# Patient Record
Sex: Female | Born: 1935
Health system: Southern US, Community
[De-identification: ages and names within clinical notes are randomized; demographics above are authoritative.]

## PROBLEM LIST (undated history)

## (undated) DIAGNOSIS — G4733 Obstructive sleep apnea (adult) (pediatric): Secondary | ICD-10-CM

## (undated) DIAGNOSIS — E039 Hypothyroidism, unspecified: Secondary | ICD-10-CM

## (undated) DIAGNOSIS — E440 Moderate protein-calorie malnutrition: Secondary | ICD-10-CM

## (undated) DIAGNOSIS — E785 Hyperlipidemia, unspecified: Secondary | ICD-10-CM

## (undated) DIAGNOSIS — I10 Essential (primary) hypertension: Secondary | ICD-10-CM

## (undated) DIAGNOSIS — J90 Pleural effusion, not elsewhere classified: Secondary | ICD-10-CM

## (undated) DIAGNOSIS — I5031 Acute diastolic (congestive) heart failure: Secondary | ICD-10-CM

## (undated) DIAGNOSIS — J45909 Unspecified asthma, uncomplicated: Secondary | ICD-10-CM

## (undated) DIAGNOSIS — N049 Nephrotic syndrome with unspecified morphologic changes: Secondary | ICD-10-CM

## (undated) DIAGNOSIS — N184 Chronic kidney disease, stage 4 (severe): Secondary | ICD-10-CM

## (undated) HISTORY — PX: KNEE SURGERY: SHX244

---

## 2000-09-27 ENCOUNTER — Encounter (INDEPENDENT_AMBULATORY_CARE_PROVIDER_SITE_OTHER): Payer: Self-pay | Admitting: Specialist

## 2000-09-27 ENCOUNTER — Other Ambulatory Visit: Admission: RE | Admit: 2000-09-27 | Discharge: 2000-09-27 | Payer: Self-pay | Admitting: Internal Medicine

## 2000-10-18 ENCOUNTER — Other Ambulatory Visit: Admission: RE | Admit: 2000-10-18 | Discharge: 2000-10-18 | Payer: Self-pay | Admitting: Obstetrics and Gynecology

## 2000-11-08 ENCOUNTER — Encounter: Payer: Self-pay | Admitting: Orthopedic Surgery

## 2000-11-08 ENCOUNTER — Encounter: Admission: RE | Admit: 2000-11-08 | Discharge: 2000-11-08 | Payer: Self-pay | Admitting: Orthopedic Surgery

## 2000-11-09 ENCOUNTER — Encounter (INDEPENDENT_AMBULATORY_CARE_PROVIDER_SITE_OTHER): Payer: Self-pay | Admitting: *Deleted

## 2000-11-09 ENCOUNTER — Ambulatory Visit (HOSPITAL_BASED_OUTPATIENT_CLINIC_OR_DEPARTMENT_OTHER): Admission: RE | Admit: 2000-11-09 | Discharge: 2000-11-09 | Payer: Self-pay | Admitting: Orthopedic Surgery

## 2003-02-01 ENCOUNTER — Encounter (INDEPENDENT_AMBULATORY_CARE_PROVIDER_SITE_OTHER): Payer: Self-pay | Admitting: *Deleted

## 2003-02-01 ENCOUNTER — Observation Stay (HOSPITAL_COMMUNITY): Admission: RE | Admit: 2003-02-01 | Discharge: 2003-02-02 | Payer: Self-pay | Admitting: General Surgery

## 2003-07-01 ENCOUNTER — Inpatient Hospital Stay (HOSPITAL_COMMUNITY): Admission: RE | Admit: 2003-07-01 | Discharge: 2003-07-04 | Payer: Self-pay | Admitting: Orthopedic Surgery

## 2003-07-04 ENCOUNTER — Inpatient Hospital Stay
Admission: RE | Admit: 2003-07-04 | Discharge: 2003-07-10 | Payer: Self-pay | Admitting: Physical Medicine & Rehabilitation

## 2005-03-22 ENCOUNTER — Ambulatory Visit: Payer: Self-pay | Admitting: Physical Medicine & Rehabilitation

## 2005-03-22 ENCOUNTER — Inpatient Hospital Stay (HOSPITAL_COMMUNITY): Admission: RE | Admit: 2005-03-22 | Discharge: 2005-03-25 | Payer: Self-pay | Admitting: Orthopedic Surgery

## 2005-03-25 ENCOUNTER — Inpatient Hospital Stay
Admission: RE | Admit: 2005-03-25 | Discharge: 2005-03-30 | Payer: Self-pay | Admitting: Physical Medicine & Rehabilitation

## 2005-03-25 ENCOUNTER — Ambulatory Visit: Payer: Self-pay | Admitting: Physical Medicine & Rehabilitation

## 2008-09-27 ENCOUNTER — Encounter (INDEPENDENT_AMBULATORY_CARE_PROVIDER_SITE_OTHER): Payer: Self-pay | Admitting: *Deleted

## 2009-01-29 ENCOUNTER — Encounter (INDEPENDENT_AMBULATORY_CARE_PROVIDER_SITE_OTHER): Payer: Self-pay | Admitting: *Deleted

## 2009-02-28 ENCOUNTER — Encounter (INDEPENDENT_AMBULATORY_CARE_PROVIDER_SITE_OTHER): Payer: Self-pay | Admitting: *Deleted

## 2009-03-03 ENCOUNTER — Ambulatory Visit: Payer: Self-pay | Admitting: Internal Medicine

## 2009-04-01 ENCOUNTER — Ambulatory Visit: Payer: Self-pay | Admitting: Internal Medicine

## 2009-04-03 ENCOUNTER — Encounter: Payer: Self-pay | Admitting: Internal Medicine

## 2010-04-14 NOTE — Letter (Signed)
Summary: Patient Notice- Polyp Results  [facility]  [address] [place], [place] [address]   Phone: [phone]  Fax: [phone]        April 03, 2009 MRN: [id number]    JAZARIYAH GRIBBINS [place], [place]  [address]    Dear [name],  I am pleased to inform you that the colon polyp(s) removed during your recent colonoscopy was (were) found to be benign (no cancer detected) upon pathologic examination.    Additional information/recommendations:  __ No further action with gastroenterology is needed at this time. Please      follow-up with your primary care physician for your other healthcare      needs.  Please call us if you are having persistent problems or have questions about your condition that have not been fully answered at this time.  Sincerely,  Irene Shipper MD  This letter has been electronically signed by your physician.  Appended Document: Patient Notice- Polyp Results Letter mailed 1.21.11

## 2010-04-14 NOTE — Letter (Signed)
Summary: Recall Colonoscopy Letter  [facility]  [place], [place] [address]   Phone: [phone]  Fax: [phone]      September 27, 2008 MRN: [id number]   DELAPHINE DALMAU [place], [place]  [address]   Dear [name],   According to your medical record, it is time for you to schedule a Colonoscopy. The American Cancer Society recommends this procedure as a method to detect early colon cancer. Patients with a family history of colon cancer, or a personal history of colon polyps or inflammatory bowel disease are at increased risk.  This letter has beeen generated based on the recommendations made at the time of your procedure. If you feel that in your particular situation this may no longer apply, please contact our office.  Please call our office at [phone] to schedule this appointment or to update your records at your earliest convenience.  Thank you for cooperating with [place] to provide you with the very best care possible.   Sincerely,  Docia Chuck. Henrene Pastor, M.D  [facility] [phone]

## 2010-04-14 NOTE — Letter (Signed)
Summary: Previsit letter  [facility]  [place], [place] [address]   Phone: [phone]  Fax: [phone]       01/29/2009 MRN: [id number]  Jacqueline Li [address] [place], [place]  [address]  [place]  Dear [name],  Welcome to the Gastroenterology Division at [facility].    You are scheduled to see a nurse for your pre-procedure visit on 03-03-09 at 1pm on the 3rd floor at [facility] [place] [employer].  We ask that you try to arrive at our office 15 minutes prior to your appointment time to allow for check-in.  Your nurse visit will consist of discussing your medical and surgical history, your immediate family medical history, and your medications.    Please bring a complete list of all your medications or, if you prefer, bring the medication bottles and we will list them.  We will need to be aware of both prescribed and over the counter drugs.  We will need to know exact dosage information as well.  If you are on blood thinners (Coumadin, Plavix, Aggrenox, Ticlid, etc.) please call our office today/prior to your appointment, as we need to consult with your physician about holding your medication.   Please be prepared to read and sign documents such as consent forms, a financial agreement, and acknowledgement forms.  If necessary, and with your consent, a friend or relative is welcome to sit-in on the nurse visit with you.  Please bring your insurance card so that we may make a copy of it.  If your insurance requires a referral to see a specialist, please bring your referral form from your primary care physician.  No co-pay is required for this nurse visit.     If you cannot keep your appointment, please call [phone] to cancel or reschedule prior to your appointment date.  This allows [place] the opportunity to schedule an appointment for another patient in need of care.    Thank you for choosing [facility] for your medical needs.  We  appreciate the opportunity to care for you.  Please visit [place] at our website  to learn more about our practice.                     Sincerely.                                                                                                                   The Gastroenterology Division

## 2010-04-14 NOTE — Letter (Signed)
Summary: [facility] Instructions  [facility]  [place], [place] [address]   Phone: [phone]  Fax: [phone]       Jacqueline Li    [birth year]    MRN: [id number]        Procedure Day Sudie Grumbling: [facility] 03/19/09     Arrival Time: 8:30AM     Procedure Time: 9:30AM     Location of Procedure:                    [facility] (4th Floor)                        [place]   Starting 5 days prior to your procedure 03/14/09 do not eat nuts, seeds, popcorn, corn, beans, peas,  salads, or any raw vegetables.  Do not take any fiber supplements (e.g. Metamucil, Citrucel, and Benefiber).  THE DAY BEFORE YOUR PROCEDURE         DATE: TUESDAY  DAY: TUESDAY  1.  Drink clear liquids the entire day-NO SOLID FOOD  2.  Do not drink anything colored red or purple.  Avoid juices with pulp.  No orange juice.  3.  Drink at least 64 oz. (8 glasses) of fluid/clear liquids during the day to prevent dehydration and help the prep work efficiently.  CLEAR LIQUIDS INCLUDE: Water Jello Ice Popsicles Tea (sugar ok, no milk/cream) Powdered fruit flavored drinks Coffee (sugar ok, no milk/cream) Gatorade Juice: apple, white grape, white cranberry  Lemonade Clear bullion, consomm, broth Carbonated beverages (any kind) Strained chicken noodle soup Hard Candy                             4.  In the morning, mix first dose of MoviPrep solution:    Empty 1 Pouch A and 1 Pouch B into the disposable container    Add lukewarm drinking water to the top line of the container. Mix to dissolve    Refrigerate (mixed solution should be used within 24 hrs)  5.  Begin drinking the prep at 5:00 p.m. The MoviPrep container is divided by 4 marks.   Every 15 minutes drink the solution down to the next mark (approximately 8 oz) until the full liter is complete.   6.  Follow completed prep with 16 oz of clear liquid of your choice (Nothing  red or purple).  Continue to drink clear liquids until bedtime.  7.  Before going to bed, mix second dose of MoviPrep solution:    Empty 1 Pouch A and 1 Pouch B into the disposable container    Add lukewarm drinking water to the top line of the container. Mix to dissolve    Refrigerate  THE DAY OF YOUR PROCEDURE      DATE: 03/19/09 DAY: WEDNESDAY  Beginning at 4:30a.m. (5 hours before procedure):         1. Every 15 minutes, drink the solution down to the next mark (approx 8 oz) until the full liter is complete.  2. Follow completed prep with 16 oz. of clear liquid of your choice.    3. You may drink clear liquids until 7:30AM (2 HOURS BEFORE PROCEDURE).   MEDICATION INSTRUCTIONS  Unless otherwise instructed, you should take regular prescription medications with a small sip of water   as early as possible the morning of your procedure.  Additional medication instructions:  Do not take Hydrochlorathiazide morning of procedure._         OTHER INSTRUCTIONS  You will need a responsible adult at least 75 years of age to accompany you and drive you home.   This person must remain in the waiting room during your procedure.  Wear loose fitting clothing that is easily removed.  Leave jewelry and other valuables at home.  However, you may wish to bring a book to read or  an iPod/MP3 player to listen to music as you wait for your procedure to start.  Remove all body piercing jewelry and leave at home.  Total time from sign-in until discharge is approximately 2-3 hours.  You should go home directly after your procedure and rest.  You can resume normal activities the  day after your procedure.   The day of your procedure you should not:   Drive   Make legal decisions   Operate machinery   Drink alcohol   Return to work  You will receive specific instructions about eating, activities and medications before you leave.    The above instructions have been reviewed and  explained to me by   Alphonsa Gin RN  March 03, 2009 1:09 PM    I fully understand and can verbalize these instructions _____________________________ Date _________

## 2010-04-14 NOTE — Procedures (Signed)
Summary: Colonoscopy  Patient: Jacqueline Li Note: All result statuses are Final unless otherwise noted.  Tests: (1) Colonoscopy (COL)   COL Colonoscopy           [facility]     [place], [place]  [address]           COLONOSCOPY PROCEDURE REPORT           PATIENT:  Jacqueline, Li  MR#:  [id number]     BIRTHDATE:  1927/07/31, 81 yrs. old  GENDER:  female           ENDOSCOPIST:  Docia Chuck. Geri Seminole, MD     Referred by:  Surveillance Program Recall,           PROCEDURE DATE:  04/01/2009     PROCEDURE:  Colonoscopy with snare polypectomy x 4     ASA CLASS:  Class II     INDICATIONS:  history of pre-cancerous (adenomatous) colon polyps     (2002 and 2005 w/ small adenomas)           MEDICATIONS:   Fentanyl 50 mcg IV, Versed 7 mg IV           DESCRIPTION OF PROCEDURE:   After the risks benefits and     alternatives of the procedure were thoroughly explained, informed     consent was obtained.  Digital rectal exam was performed and     revealed no abnormalities.   The LB CF-H180AL [id number] endoscope     was introduced through the anus and advanced to the cecum, which     was identified by both the appendix and ileocecal valve, without     limitations.Time to cecum = 6:00 min. The quality of the prep was     excellent, using MoviPrep.  The instrument was then slowly (time     =13:32 min) withdrawn as the colon was fully examined.     <<PROCEDUREIMAGES>>           FINDINGS:  Four polyps were found ascending colon (39mm,4mm,6mm)     and sigmoid colon (11mm). Polyps were snared without cautery.     Retrieval was successful.   Severe diverticulosis was found     throughout the colon.   Retroflexed views in the rectum revealed     internal hemorrhoids.    The scope was then withdrawn from the     patient and the procedure completed.           COMPLICATIONS:  None           ENDOSCOPIC IMPRESSION:     1) Four polyps ascending colon and sigmoid colon -  removed     2) Severe diverticulosis throughout the colon     3) Internal hemorrhoids           RECOMMENDATIONS:     1) Return to the care of your primary provider. GI follow up as     needed           ______________________________     Docia Chuck. Geri Seminole, MD           CC:  Leanna Battles, MD; The Patient           n.     eSIGNED:   Docia Chuck. Geri Seminole at 04/01/2009 02:44 PM           Jacqueline Li, [id number]  Note: An exclamation  mark (!) indicates a result that was not dispersed into the flowsheet. Document Creation Date: 04/01/2009 2:44 PM _______________________________________________________________________  (1) Order result status: Final Collection or observation date-time: 04/01/2009 14:36 Requested date-time:  Receipt date-time:  Reported date-time:  Referring Physician:   Ordering Physician: Lavena Bullion [phone]) Specimen Source:  Source: Tawanna Cooler Order Number: [id number] Lab site:

## 2010-04-14 NOTE — Miscellaneous (Signed)
Summary: Moviprep rx  Clinical Lists Changes  Medications: Added new medication of MOVIPREP 100 GM  SOLR (PEG-KCL-NACL-NASULF-NA ASC-C) As per prep instructions. - Signed Rx of MOVIPREP 100 GM  SOLR (PEG-KCL-NACL-NASULF-NA ASC-C) As per prep instructions.;  #1 x 0;  Signed;  Entered by: Alphonsa Gin RN;  Authorized by: Irene Shipper MD;  Method used: Electronically to [facility] [address] [place], [place]  [id number], Ph: [phone], Fax: [phone]    Prescriptions: MOVIPREP 100 GM  SOLR (PEG-KCL-NACL-NASULF-NA ASC-C) As per prep instructions.  #1 x 0   Entered by:   Alphonsa Gin RN   Authorized by:   Irene Shipper MD   Signed by:   Alphonsa Gin RN on 03/03/2009   Method used:   Electronically to        [facility] (retail)       [place], [place]  [id number]       Ph: [phone]       Fax: [phone]   RxID:   [id number]

## 2010-07-31 NOTE — Op Note (Signed)
NAME:  Jacqueline Li                           ACCOUNT NO.:  [id number]   MEDICAL RECORD NO.:  [id number]                   PATIENT TYPE:  INP   LOCATION:  [id number]                                 FACILITY:  [facility]   PHYSICIAN:  Gaynelle Arabian, M.D.                 DATE OF BIRTH:  October 18, 1927   DATE OF PROCEDURE:  07/01/2003  DATE OF DISCHARGE:                                 OPERATIVE REPORT   PREOPERATIVE DIAGNOSIS:  Osteoarthritis, left knee.   POSTOPERATIVE DIAGNOSIS:  Osteoarthritis, left knee.   PROCEDURE:  Left total knee arthroplasty.   SURGEON:  Dione Plover. Aluisio, M.D.   ASSISTANT:  Arlee Muslim, P.A.   ANESTHESIA:  General with postop Marcaine pain pump.   DRAINS:  Hemovac x 1.   COMPLICATIONS:  None.   TOURNIQUET TIME:  52 minutes at 300 mmHg.   CONDITION:  Stable to recovery.   BRIEF CLINICAL NOTE:  Jacqueline Li is a 77 year old female with severe end-  stage osteoarthritis of the left knee with pain refractory to nonoperative  management.  She presents now for left total knee arthroplasty.   PROCEDURE IN DETAIL:  After the successful administration of general  anesthetic, a tourniquet is placed high on the left thigh and left lower  extremity prepped and draped in the usual sterile fashion.  Extremity is  wrapped in Esmarch, knee flexed, tourniquet inflated to 300 mmHg.  Midline  incision is made with a 10 blade through the subcutaneous tissue to the  level of the extensor mechanism.  A fresh blade is used to make a medial  parapatellar arthrotomy.  Then the soft tissue over the proximal and medial  tibia is subperiosteally elevated to the joint line with a knife and into  the semimembranous bursa with a curved osteotome.  Patella was then everted,  knee flexed 90 degrees, ACL PCL removed.  Drill is used to create a starting  hole in the distal femur; canal is irrigated, and then the 5-degree left  valgus alignment guide placed.  Referencing off the posterior  condyle,  rotation is marked and the block pinned to remove 10 mm off the distal  femur.  Distal femoral resection is made with an oscillating saw.  The  sizing block is placed, and size 4 is most appropriate.  The size 4 cutting  block is placed with rotation marked at the epicondylar axis and then the  anterior and posterior cuts made.   The tibia is then subluxed forward and the menisci removed.  The  extramedullary tibial alignment guide is placed referencing proximally at  the medial aspect of the tibial tubercle and distally on the second  metatarsal axis and tibial crest.  The block is pinned to remove 10 mm off  the nondeficient lateral side.  Tibial resection is made with an oscillating  saw.  The size 3 is the most appropriate tibial component.  Then the  proximal tibia is then prepared with the modular drill and keel punch.  Femoral preparation is completed with the intercondylar and chamfer cuts.   Trial size 3 mobile bearing tibia with a size 4 posterior stabilized femur  and then a size 4, 10 mm posterior stabilized rotating platform insert are  placed.  She is within a few degrees of full extension with excellent varus  and valgus balance throughout full range of motion.  This was a major  improvement over her 25-degree preop flexion contracture.  She had excellent  balance throughout motion from extension down through 125 degrees of  flexion.  The patella is everted and the thickness measured to be 25 mm,  free hand resection taken to 14 mm, 38 template placed, lug holes drilled,  trial patella placed, and it tracks normally.  The osteophytes are then  removed off the posterior femur with the trial in place.  The cut bone  surfaces are then prepared with pulsatile lavage, cement mixed and once  ready for implantation, the size 3 mobile bearing tibial tray, size 4  posterior stabilized femur, and 38 patella are cemented into place, and the  patella is held with a clamp.   Trial 10 mm insert is placed, knee held in  full extension, and all extruded cement removed.  Once the cement is fully  hardened, then the permanent 10 mm posterior stabilized rotating platform  insert is placed into the tibial tray.  The wound is copiously irrigated  with antibiotic solution and extensor mechanism closed over a Hemovac drain  with interrupted #1 PDS.  Flexion against gravity is 135 degrees.  Tourniquet was released for a total time of 52 minutes.  Subcu is closed  with interrupted 2-0 Vicryl, subcuticular running 4-0 Monocryl.  The  catheter for the pain pump is placed.  The pump is initiated.  Hemovac  hooked to suction.  The incision is clean and dry and Steri-Strips and a  bulky sterile dressing applied.  She is then awakened and transported to  recovery in stable condition.                                               Gaynelle Arabian, M.D.    FA/MEDQ  D:  07/01/2003  T:  07/01/2003  Job:  [id number]

## 2010-07-31 NOTE — Op Note (Signed)
NAME:  Jacqueline Li, Jacqueline Li                 ACCOUNT NO.:  [id number]   MEDICAL RECORD NO.:  [id number]          PATIENT TYPE:  INP   LOCATION:  0001                         FACILITY:  [facility]   PHYSICIAN:  Gaynelle Arabian, M.D.    DATE OF BIRTH:  01-18-1928   DATE OF PROCEDURE:  03/22/2005  DATE OF DISCHARGE:                                 OPERATIVE REPORT   PREOPERATIVE DIAGNOSIS:  Osteoarthritis right knee.   POSTOPERATIVE DIAGNOSIS:  Osteoarthritis right knee.   PROCEDURE:  Right total knee arthroplasty.   SURGEON:  Gaynelle Arabian, M.D.   ASSISTANT:  Alexzandrew L. Dara Lords, P.A.   ANESTHESIA:  General with postop Marcaine pain pump.   ESTIMATED BLOOD LOSS:  Minimal.   DRAIN:  Hemovac x1.   TOURNIQUET TIME:  46 minutes at 300 mmHg.   COMPLICATIONS:  None.   CONDITION:  Stable to recovery.   BRIEF CLINICAL NOTE:  Ms. Broshears is a 77 year old female with end-stage  osteoarthritis of the right knee with intractable pain. She has had a  previous successful left total knee arthroplasty; and presents now for right  total knee arthroplasty.   PROCEDURE IN DETAIL:  After successful administration of general anesthetic,  a tourniquet was placed on the right thigh and right lower extremity prepped  and draped in the usual sterile fashion. Extremity was wrapped in Esmarch,  knee flexed, tourniquet inflated to 300 mmHg. A standard midline incision  was made with a 10-blade through the subcutaneous tissue to the level of the  extensor mechanism. A fresh blade is used to make a medial parapatellar  arthrotomy, then the soft tissue over the proximal medial tibia  subperiosteally elevated to the joint line with a knife; and into the  semimembranosus bursa with a Cobb elevator. Soft tissue of the proximal  lateral tibia is also elevated with attention being paid to avoid the  patellar tendon on tibial tubercle. The patella is everted and knee flexed  90 degrees. ACL and PCL removed. Drill was  used to create a starting hole in  the distal femur and the canal irrigated. A 5-degrees right valgus alignment  guide is placed and referencing up the posterior condyle rotation is marked;  and the block is pinned to remove 11 mm off the distal femur. We took 2 mm  more than usual due to her flexion contracture. The sizing block is placed,  size 3 is most appropriate. The rotation is marked off the epicondylar axis;  and the size 3 cutting block is placed and the anterior, posterior, and  chamfer cuts are made.   The tibia subluxed forward and the menisci removed. Extramedullary tibial  alignment guide is placed referencing proximally to the medial aspect of the  tibial tubercle and distally along the second metatarsal axis and tibial  crest. The block is pinned to remove 10 mm off the non deficient lateral  side. Tibial resection is made with an oscillating saw. Size 3 is the most  appropriate tibial component; and the proximal tibia is prepared with the  modular drill and keel punch for a size  3. The femoral preparation is  completed with the intercondylar cut.   A size 3 mobile bearing tibial trial with a size 3 posterior stabilized  femoral trial and a 10-mm, posterior, stabilized rotating platform insert  trial were placed. With the 10, full extension is achieved with excellent  varus and valgus balance throughout full range of motion. The patella was  then everted.  Thickness measured to be 24 mm. Freehand resection is taken  to 14 mm; 38 template is placed; lug holes are drilled; trial patella is  placed and it tracks normally. The osteophytes and then removed off the  posterior femur with the trials in place. All trials are removed and the cut  bone surfaces prepared with pulsatile lavage.   Cement is mixed and once ready for implantation the size 3 mobile bearing  tibial tray, size 3 posterior stabilized femur, and 38 patella are cemented  into place; and the patella is held  with the clamp. A trial 10-mm insert is  placed and the knee held in full extension and all extruded cement removed.  Once the cement was fully hardened, then a permanent 10-mm, posterior,  stabilized rotating platform insert is placed into the tibial tray. The  wound is copiously irrigated with saline solution, and the extensor  mechanism closed over Hemovac drain with interrupted #1 PDS. Flexion against  gravity is 135 degrees. Tourniquet is released for a total time of 46  minutes. Subcu is closed with interrupted 2-0 Vicryl; subcuticular with  running 4-0 Monocryl. The catheter for the Marcaine pain pump is then  placed; and the pump is initiated. The Hemovac drain is then hooked to  suction. Steri-Strips and a bulky sterile dressing were applied; and she is  placed into a knee immobilizer, awakened, and transported to recovery in  stable condition.      Gaynelle Arabian, M.D.  Electronically Signed     FA/MEDQ  D:  03/22/2005  T:  03/23/2005  Job:  [id number]

## 2010-07-31 NOTE — Op Note (Signed)
NAME:  MARIETTA, BUSHNELL                           ACCOUNT NO.:  [id number]   MEDICAL RECORD NO.:  [id number]                   PATIENT TYPE:  AMB   LOCATION:  DAY                                  FACILITY:  [facility]   PHYSICIAN:  Marland Kitchen T. Hoxworth, M.D.          DATE OF BIRTH:  [birth year]   DATE OF PROCEDURE:  02/01/2003  DATE OF DISCHARGE:                                 OPERATIVE REPORT   PREOPERATIVE DIAGNOSIS:  Cholelithiasis with cholecystitis.   POSTOPERATIVE DIAGNOSIS:  Cholelithiasis with cholecystitis.   PROCEDURE:  Laparoscopic cholecystectomy with intraoperative cholangiogram.   SURGEON:  Marland Kitchen T. Hoxworth, M.D.   ASSISTANTLew Dawes. Rosana Hoes, M.D.   ANESTHESIA:  General.   BRIEF HISTORY:  Laisha Troendle is a 77 year old white female with episodic  epigastric abdominal pain. She has also had mildly elevated LFTs and lipase  on occasion. Ultrasound shows echogenic material in the gallbladder  consistent with sludge or small stones. Laparoscopic cholecystectomy with  cholangiogram has been recommended. The nature of the procedure,  indications, risks of bleeding, infection, bile leak and bile duct injury  were discussed and understood preoperatively. She is now brought to the  operating room for this procedure.   DESCRIPTION OF PROCEDURE:  The patient was brought to the operating room,  placed in supine position on the operating table and general endotracheal  anesthesia was induced. PAS were in place. She received preoperative  antibiotics. The abdomen was widely sterilely prepped and draped. The  abdomen was accessed through a 1 cm open Hasson incision at the umbilicus  and standard four port technique was used. The gallbladder appeared somewhat  edematous, slightly thick walled. The fundus was grasped and elevated over  the liver, infundibulum retracted inferolaterally. The fibrofatty tissue was  stripped down off the neck of the gallbladder toward the porta hepatis.  The  distal gallbladder and Calot's triangle was thoroughly dissected. The cystic  duct was identified and the cystic duct gallbladder junction dissected 360  degrees. When the anatomy was clear, the cystic duct was clipped at the  gallbladder junction, operative cholangiogram obtained through the cystic  duct. This showed good filling of normal intrahepatic ducts and common bile  duct with free flow into the duodenum. There were some slight filling  defects of the distal common bile duct which appeared to fill out and just  be some mucosal folds although a little bit of sludge in this area could not  be ruled out. There was no obstruction. The Cholangiocath was then removed  and the cystic duct was triply clipped proximally and divided. The cystic  artery clearly identified going up the gallbladder wall was divided between  two proximal and one distal clip. The gallbladder was then dissected free  from its bed using hook cautery. The posterior branch of the artery was  clipped. The gallbladder was removed through the umbilicus. The right upper  quadrant was thoroughly  irrigated and complete hemostasis assured. Trocars  removed under direct  vision, all CO2 evacuated. The mattress suture was secured at the umbilicus.  Skin incisions were closed with interrupted subcuticular 4-0 Monocryl and  Steri-Strips. Sponge, needle and instrument counts were correct. Dry sterile  dressings were applied and the patient taken to recovery in good condition.                                               Darene Lamer. Hoxworth, M.D.    [place]  D:  02/01/2003  T:  02/01/2003  Job:  [id number]

## 2010-07-31 NOTE — Discharge Summary (Signed)
NAMEAURIELLA, STCHARLES NO.:  [id number]   MEDICAL RECORD NO.:  [id number]          PATIENT TYPE:  INP   LOCATION:  [id number]                         FACILITY:  [facility]   PHYSICIAN:  Gaynelle Arabian, M.D.    DATE OF BIRTH:  December 24, 1927   DATE OF ADMISSION:  03/22/2005  DATE OF DISCHARGE:  03/25/2005                                 DISCHARGE SUMMARY   ADMISSION DIAGNOSES:  1.  Osteoarthritis right knee.  2.  Asthma.  3.  Hypertension.  4.  Hypercholesterolemia.  5.  Hemorrhoids.   DISCHARGE DIAGNOSES:  1.  Osteoarthritis right knee status post right total knee arthroplasty.  2.  Postoperative hypokalemia, improved.  3.  Postoperative hyponatremia.  4.  Asthma.  5.  Hypertension.  6.  Hypercholesterolemia.  7.  Hemorrhoids.   PROCEDURE:  On March 22, 2005 right total knee, surgeon Dr. Wynelle Link,  assistant Arlee Muslim, PA-C, anesthesia general. Tourniquet time 46  minutes.   BRIEF HISTORY:  Ms. Haydon is a 77 year old female with end-stage arthritis  of the right knee with intractable pain, successful left total knee and now  presents with right total knee.   CONSULTATIONS:  Rehab services.   LABORATORY DATA:  Preop CBC, hemoglobin 15, hematocrit of 43.3. Postop  hemoglobin 11.9, last noted H&H 10.8 and 31.0. PT and PTT preop 13.5 and 33  respectively. INR 1.0. Serial pro times followed, last noted PT/INR 15.3 and  1.2. Chem panel on admission elevated ALT of 54, elevated total bili of 1.3.  Remaining chem panel within normal limits. Serial BMETs are followed. Sodium  did drop from 138 to 132, stabilized at 132. Potassium went down to 3.4,  back up to 3.7. Preop UA, trace leukocyte esterase,  0-2 white cells,  otherwise negative. Blood group type O positive. EKG on March 17, 2005,  borderline EKG normal sinus rhythm unconfirmed.   HOSPITAL COURSE:  Admitted to [facility], tolerated procedure  well, later to the recovery room and then to the orthopedic  floor, started  on PCA and p.o. analgesics, hemovac drain placed at the time of surgery was  pulled on day 1. He underwent a rehab eval and felt the patient could  possibly benefit from [facility] depending on progress. By day 2 had been doing  better, had cramps on day 1 which had improved. By day 2 she had a little  bit of hypokalemia and was given K-Dur for this. She started getting up with  physical therapy. By day 3, the patient was resting well, had been weaned  over the patient p.o. meds. It was noted later that day that a bed became  available on the rehab SACU  unit. The patient lived alone, felt to be a  good candidate and was transferred at that time.   PLAN:  1.  The patient was transferred to [facility] .  2.  Discharge diagnoses please see above.  3.  Discharge meds:  Continue current medications as per the [facility].  4.  Diet as tolerated.  5.  Activity - weightbearing as tolerated,  total knee protocol. PT and OT      for gait training, ambulation and ADLs. Followup 2 weeks from surgery or      following the discharge from rehab unit.   DISPOSITION:  [facility].   CONDITION ON DISCHARGE:  Improved.      Alexzandrew L. Dara Lords, P.A.      Gaynelle Arabian, M.D.  Electronically Signed    ALP/MEDQ  D:  05/13/2005  T:  05/14/2005  Job:  [id number]   cc:   Rehab Services   Ermalene Searing. Philip Aspen, M.D.  Fax: [phone]

## 2010-07-31 NOTE — Discharge Summary (Signed)
NAME:  Jacqueline Li, Jacqueline Li                           ACCOUNT NO.:  [id number]   MEDICAL RECORD NO.:  [id number]                   PATIENT TYPE:  INP   LOCATION:  [id number]                                 FACILITY:  [facility]   PHYSICIAN:  Gaynelle Arabian, M.D.                 DATE OF BIRTH:  1927/10/14   DATE OF ADMISSION:  07/01/2003  DATE OF DISCHARGE:  07/04/2003                                 DISCHARGE SUMMARY   ADMISSION DIAGNOSES:  1. Osteoarthritis, left knee.  2. Hypertension.  3. Hemorrhoids.  4. Hypercholesterolemia.   DISCHARGE DIAGNOSES:  1. Osteoarthritis, left knee, status post left total knee arthroplasty.  2. Mild postoperative blood loss anemia, did not require transfusion.  3. Postoperative hyponatremia, improving  4. Postoperative hypokalemia, improved  5. Hypertension.  6. Hemorrhoids.  7. Hypercholesterolemia.   PROCEDURE:  Date of surgery, July 01, 2003, left total knee arthroplasty.  Surgeon Dr. Gaynelle Arabian, assistant Arlee Muslim, PA-C.  Anesthesia general  with a postop Marcaine pain pump.  Hemovac drain x 1.  Tourniquet time 52  minutes, 300 mmHg.   BRIEF HISTORY:  Jacqueline Li is a 77 year old female with severe end-stage  arthritis of the left knee.  Pain has been refractory to nonoperative  management.  She presents now for left total knee arthroplasty.   CONSULTS:  Rehabilitation services, Dr. Naaman Plummer.   LABORATORY DATA:  CBC on admission:  Hemoglobin 14.7, hematocrit 43.0, white  cell count 7.0, differential within normal limits.  Postop H&H 10.8 and  31.5.  Last noted H&H 10.3 and 29.8.  PT/PTT preop 12.4 and 32 respectively.  INR 0.9.  Serial pro times followed.  Last noted PT/INR 15.2 and 1.3.  Chem  panel on admission all within normal limits.  Serial BMETs are followed.  Sodium dropped from 141 down to 127 and back up to 132.  Potassium 4.0 down  to 3.3, and back up to .37.  Chloride dropped from 102 to 89 back up to 96.  Glucose 109-156 back down to  105.  Calcium dropped 9.8 down to 7.9.  Urinalysis preop, moderate leukocyte esterase with only a few epithelial  cells, 3-6 white cells, rare bacteria.  Blood group type O positive.  Chest  x-ray dated January 30, 2003, no active disease, probable small calcified  granuloma on the right lung.   HOSPITAL COURSE:  The patient was admitted to [facility], taken to  the OR, underwent the above-stated procedure without complications.  The  patient tolerated the procedure well and later transferred to recovery and  then to the orthopedic floor for continued postop care.  Vital signs were  followed.  The patient was given 24 hours postop IV antibiotics in the form  of Ancef, Coumadin for DVT prophylaxis.  PT and OT were consulted.  Rehab  consulted postop.  The patient was placed weightbearing as tolerated, start  back on her  home medications.  Hemovac drain which was placed at time of  surgery was pulled on postop day one.  She got up and walked about 20 feet  on day one and was actually dong quite well.  She had a little bit of volume  overload and a low sodium.  The fluids were decreased, and she underwent a  little bit of mild diuresis.  By day two, her sodium had dropped a little  bit further down to 127.  She also had a low potassium of 3.3.  She was  placed on potassium supplements.  The patient's medical doctor was Dr.  Sharlett Iles.  He came by as a courtesy and did notice that she had a little  bit of drop in her sodium.  Hydrochlorothiazide was discontinued.  Her  sodium actually did improve.  She started to progress well with physical  therapy.  By day three, her sodium was back up to 132, and her potassium  responded at 3.7, and both had improved.  She had been seen postop by Dr.  Naaman Plummer, and felt she would be an excellent candidate for rehab services.  She is progressing well with physical therapy.  She was making improvements,  and a bed did become available on the [place] on  July 04, 2003.  Medically she  was stable and improving from an orthopedic standpoint and was transferred  over at that time for continued therapy.   DISCHARGE PLAN:  The patient discharged to [facility] on July 04, 2003.   DISCHARGE DIAGNOSES:  Please see above.   DISCHARGE MEDICATIONS:  Percocet for pain.  Robaxin for spasm.  Coumadin for  DVT prophylaxis.  Continue home medications.   DIET:  Hypertensive type diet, low cholesterol.   ACTIVITIES:  1. Weightbearing as tolerated to the left lower extremity.  2. Continue __________ ambulation, ADLs as per PT/OT while on rehab and     SACU.   FOLLOW UP:  Two weeks from surgery or following discharge from [facility].  Call office for an appointment at [phone].   DISPOSITION:  To [place] [facility].   CONDITION ON DISCHARGE:  Improved.     Jacqueline Li, P.A.              Gaynelle Arabian, M.D.    ALP/MEDQ  D:  07/24/2003  T:  07/24/2003  Job:  [id number]   cc:   Gaynelle Arabian, M.D.  [facility]  [address]  [place] [address]  Fax: [place]. Philip Aspen, M.D.  [address]  [place]  [place] [address]  Fax: [phone]   Meredith Staggers, M.D.  [place]. [address] [place]  [place] [address]  Fax: [phone]

## 2010-07-31 NOTE — H&P (Signed)
NAME:  Jacqueline Li, Jacqueline Li                           ACCOUNT NO.:  [id number]   MEDICAL RECORD NO.:  [id number]                   PATIENT TYPE:  INP   LOCATION:  NA                                   FACILITY:  [facility]   PHYSICIAN:  Gaynelle Arabian, M.D.                 DATE OF BIRTH:  30-Nov-1927   DATE OF ADMISSION:  DATE OF DISCHARGE:                                HISTORY & PHYSICAL   ANTICIPATED DATE OF ADMISSION:  The patient see in the office for History  and Physical June 25, 2003 for admission on July 01, 2003.   CHIEF COMPLAINT:  Left knee pain.   HISTORY OF PRESENT ILLNESS:  The patient is a 76 year old female with a  longstanding history of progressive left knee pain.  She has had a previous  arthroscopy about 10 years ago by Dr. Durward Fortes and was told she has  arthritis at that time.  Over the past several years, progressively gotten  worse now to the point where it is hurting all the time even at night.  It  is interfering with her daily activities.  X-rays in the office show severe  end-stage arthritis of the left knee with bone-on-bone in the patellofemoral  medial compartment.  It is felt due to the pain that she is having and the  significant findings on x-rays, the patient would benefit from undergoing a  knee replacement.  Risks and benefits of the knee procedure have been  discussed with the patient and she elects to proceed with surgery.   ALLERGIES:  No known drug allergies.   CURRENT MEDICATIONS:  1. Toprol-XL 25 mg daily.  2. Hydrochlorothiazide 25 mg daily.  3. Lipitor 10 mg daily.  4. Aspirin 81 mg daily stopped prior to surgery.  5. Citracal +D two a day.  6. Centrum vitamin daily.  7. NutraJoint which is glucosamine chondroitin.  8. Darvocet-N 100 every 4 hours as needed.  9. Restasis eye drops one drop in each eye twice a day.   PAST MEDICAL HISTORY:  1. Hypertension.  2. Hypercholesterolemia.  3. Hemorrhoids.  4. Past history of gallstones.   PAST  SURGICAL HISTORY:  1. Hysterectomy in 1976.  2. Left knee scope in 1994.  3. Small cyst on her palm/hand surgery 2002.  4. Cataract 2004.  5. Gallbladder November 2004.   SOCIAL HISTORY:  Widowed, retired, nonsmoker, four glasses of wine a day.  One child.  She lives alone.   PAST FAMILY HISTORY:  Mother with a history of heart disease and diabetes.  She is deceased at the age of 28.  Father deceased at age 62, history of  bright's disease.   REVIEW OF SYSTEMS:  GENERAL:  No fevers, chills, night sweats.  NEUROLOGICAL:  No seizures, syncope, paralysis.  RESPIRATORY:  No shortness  of breath, productive cough. or hemoptysis.  CARDIOVASCULAR:  No chest pain,  angina, orthopnea.  GI:  No nausea, vomiting, diarrhea, constipation.  GU:  No dysuria, hematuria, or discharge.  MUSCULOSKELETAL:  Pertinent to that of  the knee found in the History of Present Illness.   PHYSICAL EXAMINATION:  VITAL SIGNS:  Pulse 60, respirations 14, blood  pressure 172/88.  GENERAL:  A 77 year old white female, well nourished, well developed.  In no  acute distress.  Alert, oriented, and cooperative.  HEENT:  Normocephalic, atraumatic.  Pupils round and reactive.  Oropharynx  clear.  Glasses.  EOMs are intact.  NECK:  Supple.  No carotid bruits.  CHEST:  Clear anterior and posterior chest walls.  No rhonchi, rales, or  wheezing.  HEART:  Regular rate and rhythm.  No murmurs.  ABDOMEN:  Soft, slightly round.  Bowel sounds are present.  Nontender.  RECTAL, BREASTS, GENITALIA:  Not done and not pertinent to present illness.  EXTREMITIES:  Significant as to the left knee.  She does have a varus  deformity noted on passive range of motion about 10 to 100 degrees, marked  crepitus, and no instability.   IMPRESSION:  1. Osteoarthritis left knee.  2. Hypertension.  3. Hemorrhoids.  4. Hypercholesterolemia.   PLAN:  The patient will be admitted to [facility] and undergo a  left total knee  arthroplasty.  Surgery will be performed by Dr. Gaynelle Arabian.  Her medical doctor is Dr. Leanna Battles.  He will be notified of  the room number on admission and will be consulted if needed for any medical  assistance with the patient throughout the hospital course.     Alexzandrew L. Dara Lords, P.A.              Gaynelle Arabian, M.D.    ALP/MEDQ  D:  06/30/2003  T:  06/30/2003  Job:  [id number]   cc:   Ermalene Searing. Philip Aspen, M.D.  [address]  [place]  [place] [address]  Fax: [phone]

## 2010-07-31 NOTE — H&P (Signed)
NAME:  Jacqueline Li, Jacqueline Li                 ACCOUNT NO.:  [id number]   MEDICAL RECORD NO.:  [id number]          PATIENT TYPE:  INP   LOCATION:  NA                           [facility]ACILITY[facility]  [facility]   PHYSICIAN:  Gaynelle Arabian, M.D.    DATE OF BIRTH:  06-27-1927   DATE OF ADMISSION:  03/22/2005  DATE OF DISCHARGE:                                HISTORY & PHYSICAL   CHIEF COMPLAINT:  Right knee pain.   HISTORY OF PRESENT ILLNESS:  The patient is a 77 year old female well-known  to Dr. Gaynelle Arabian, having previously undergone a left total knee back in  April of 2005.  She has done well with the previous left knee.  The right  knee has continued to be somewhat of a problem.  She has had progressive  arthritis.  She has had an arthroscopy done on that knee, and has continued  to have pain since this summer.  It was felt she would benefit from  undergoing knee replacement.  Risks and benefits discussed, and the patient  was subsequently admitted to the hospital.   ALLERGIES:  No known drug allergies.   CURRENT MEDICATIONS:  1.  Citrucel Plus two a day.  2.  Centrum silver daily.  3.  Aspirin 81 mg daily.  4.  Clarinex 5 mg daily.  5.  Advair 2 puffs daily.  6.  Astelin 137 mcg two sprays each nostril every 12 hours.  7.  Toprol-XL 25 mg.  8.  Hydrochlorothiazide 25 mg.  9.  Lipitor 10 mg daily.  10. Restasis eye drops one drop each eye twice daily.  11. Albuterol.   PAST MEDICAL HISTORY:  1.  Asthma.  2.  Hypertension.  3.  Hypercholesterolemia.  4.  Hemorrhoids.  5.  Calculi.  6.  History of cholelithiasis.  7.  Urinary frequency.   PAST SURGICAL HISTORY:  1.  Hysterectomy.  2.  Left hand fifth finger surgery.  3.  Gallbladder surgery.  4.  Right eye cataract in February 2005.  5.  Left eye cataract in June 2005.  6.  Left total knee replacement in April 2005.  7.  Right knee arthroscopy in July 2005.   SOCIAL HISTORY:  Widowed.  Retired.  Nonsmoker.  Three to four glasses of  wine a day.  Has 1 child.  Her daughter will be assisting with her care  after surgery.   FAMILY HISTORY:  Mother with a history of heart disease and diabetes.  Passed away at 50.  Father deceased at age 54 with history of Bright's  disease.   REVIEW OF SYSTEMS:  GENERAL:  No fevers, chills or night sweats.  NEUROLOGIC:  No seizures, syncope or paralysis.  RESPIRATORY:  History of  shortness of breath with her asthma.  No current shortness of breath, cough  or hemoptysis.  CARDIOVASCULAR:  No chest pain, angina or orthopnea.  GI:  No nausea, vomiting, diarrhea or constipation.  GU:  Some urinary frequency.  No dysuria, hematuria or discharge.  MUSCULOSKELETAL:  Right knee found in  the history of present illness.   PHYSICAL  EXAMINATION:  VITAL SIGNS:  Pulse 68, respirations 14, blood  pressure 149/96.  GENERAL:  A 77 year old white female, well-nourished, well-developed,  slightly overweight in no acute distress.  She is alert and oriented,  cooperative, very pleasant.  HEENT:  Normocephalic and atraumatic.  Pupils equal, round and reactive.  Noted to wear glasses.  Extraocular movements intact.  NECK:  Supple.  No carotid bruits.  CHEST:  Clear anterior and posterior chest walls.  No rhonchi, rales or  wheezes.  HEART:  Regular rhythm.  No murmur.  S1 and S2 noted.  ABDOMEN:  Soft, nontender.  Bowel sounds present.  Slightly round abdomen.  RECTAL/BREAST/GENITALIA:  Not done.  Not pertinent to present illness.  EXTREMITIES:  Right knee - no effusion, range of motion 5 to 115 degrees,  center medially.  Crepitus noted.   IMPRESSION:  1.  Osteoarthritis, right knee.  2.  Asthma.  3.  Hypertension.  4.  Hypercholesterolemia.  5.  Hemorrhoids.   PLAN:  The patient admitted to [facility] to undergo a right total  knee arthroplasty.  Her medical doctor is Dr. Leanna Battles, and he will  be notified of the room number on admission.  He will be consulted if needed  for  medical assistance for the patient throughout the hospital course.      Alexzandrew L. Dara Lords, P.A.      Gaynelle Arabian, M.D.  Electronically Signed    ALP/MEDQ  D:  03/21/2005  T:  03/22/2005  Job:  [id number]   cc:   Ermalene Searing. Philip Aspen, M.D.  Fax: [phone]

## 2010-07-31 NOTE — Discharge Summary (Signed)
Jacqueline, Li NO.:  [id number]   MEDICAL RECORD NO.:  [id number]          PATIENT TYPE:  ORB   LOCATION:  [id number]                         FACILITY:  [facility]   PHYSICIAN:  Meredith Staggers, M.D.DATE OF BIRTH:  1927/11/08   DATE OF ADMISSION:  03/25/2005  DATE OF DISCHARGE:  03/30/2005                                 DISCHARGE SUMMARY   DISCHARGE DIAGNOSES:  1.  Left total knee replacement secondary to osteoarthritis.  2.  Acute blood loss anemia.  3.  Hyponatremia.  4.  Cellulitis, left knee, resolving.   HISTORY OF PRESENT ILLNESS:  [name] is a 77 year old female with a  history of DJD left knee, with knee replacement, and now with progressive  arthritis right knee and pain. She elected to undergo a right total knee  replacement by Dr. Wynelle Link. Postoperative she was weight bearing as  tolerated. She had improvement for DVT prophylaxis with INR subtherapeutic  at 1.2 today. She has been mobilized with therapy and noted to have  impairment in mobility and ADLs. Subacute was consulted for further  therapies.   PAST MEDICAL HISTORY:  1.  Left total knee replacement.  2.  Hysterectomy.  3.  Cholecystectomy.  4.  T&A.  5.  Excision of cataracts.  6.  Dyslipidemia.  7.  Excision of cyst of left hand.  8.  Asthma.   ALLERGIES:  No known drug allergies.   FAMILY HISTORY:  Positive for diabetes.   SOCIAL HISTORY:  The patient lives alone in a one-level home with no steps  at entry. She was independent prior to admission. Has help with housework.  Does not use any tobacco. Drinks about four glasses of wine a day.   HOSPITAL COURSE:  [name] was admitted to subacute on March 25, 2005, for SACU level therapies to consist of PT and OT daily.  Post  admission subcu Lovenox was added as INR was on a subtherapeutic basis.  Asthma was monitored, on Advair b.i.d., and this has been stable.  Multivitamins and iron were added for acute blood loss  anemia. Blood  pressures were monitored on a b.i.d. basis, had been variable from the 0000000  to Q000111Q systolic, 123456 to [id number] diastolic. The patient reports problems with BP  control at home. She is to follow-up with LMD regarding further adjustment  in BP medications. At the time of admission the patient was noted to have  diffuse erythema of the left knee. As she continued this on January 12th,  Keflex was added for wound prophylaxis. The erythema was much improved and  by the time of discharge she is to continue on Keflex for seven total days  of antibiotic therapy. The patient's pain control has been reasonable with  the use of p.r.n. oxycodone. During her stay in subacute the patient  progressed to be at modified independent level for ADLs, modified  independent for toileting, modified independent for kitchen tasks. She was  modified independent for transfers, modified independent for rolling walker,  require supervision to navigate one step. Further follow-up therapy to  include home health, PT/OT by [facility] services. The patient's  Coumadin does remain subtherapeutic at the time of discharge with INR of  1.4.  The patient is discharged on 10 mg Coumadin daily with cross coverage  with Lovenox 40 mg daily for the next 3-4 days with hopes of INR to rise to  therapeutic levels. The patient has been independent in the room without  difficulty. She is advised to continue with mobility and ambulation q.1-2h.  while at home.   DISCHARGE MEDICATIONS:  1.  Coumadin 10 mg p.o. q.p.m.  2.  Astelin two squirts b.i.d. nasally.  3.  Lovenox 40 mg daily.  4.  Clarinex 5 mg daily.  5.  Restasis eye drops daily.  6.  Lipitor 10 mg q.h.s.  7.  Toprol XL 25 mg daily.  8.  Advair one puff b.i.d.  9.  Trinsicon one p.o. b.i.d.  10. Keflex 250 mg q.i.d.  11. Oxy-IR 5-10 mg q.4-6h. p.r.n. pain.   DIET:  Regular.   ACTIVITY:  As tolerated with the use of walker. No driving. No strenuous   activity.   WOUND CARE:  Cleanse with antibacterial soap and water, to keep clean and  dry.   FOLLOWUP:  The patient is to follow up with Dr. Wynelle Link for postoperative  check, to follow up with Dr. Sharlett Iles for routine check and BP management,  and to follow up with Dr. Naaman Plummer as needed.      Thornton Dales, P.A.      Meredith Staggers, M.D.  Electronically Signed    PP/MEDQ  D:  03/30/2005  T:  03/30/2005  Job:  [id number]   cc:   Loralee Pacas. Sharlett Iles, M.D. [facility]  [address]  [place]  [place] [address]   Gaynelle Arabian, M.D.  Fax: [phone]

## 2010-07-31 NOTE — Discharge Summary (Signed)
NAME:  Jacqueline Li, Jacqueline Li                           ACCOUNT NO.:  [id number]   MEDICAL RECORD NO.:  [id number]                   PATIENT TYPE:  ORB   LOCATION:  4502                                 FACILITY:  [facility]   PHYSICIAN:  Jarvis Morgan, M.D.                DATE OF BIRTH:  11/13/1927   DATE OF ADMISSION:  07/04/2003  DATE OF DISCHARGE:                                 DISCHARGE SUMMARY   ANTICIPATED DATE OF DISCHARGE:  July 10, 2003   DISCHARGE DIAGNOSES:  1. Left total knee arthroplasty secondary to degenerative joint disease     July 01, 2003.  2. Pain management.  3. Coumadin for deep vein thrombosis prophylaxis.  4. Hypertension.  5. Hyperlipidemia.   HISTORY OF PRESENT ILLNESS:  A 77 year old white female admitted to [facility] April 18th with end-stage degenerative joint disease of the  left knee.  No change with conservative care.  Underwent a left total knee  arthroplasty April 18th per Dr. Gaynelle Arabian.  Placed on Coumadin for deep  vein thrombosis prophylaxis and weight bearing as tolerated.  Postoperative  mild hyponatremia at 130.  Pain management with Tylox.  Hospital course  uneventful.  No chest pain, no nausea or vomiting.  She was minimal assist  for basic mobility.  Admitted for comprehensive rehab program.   PAST MEDICAL HISTORY:  See discharge diagnoses.   PAST SURGICAL HISTORY:  1. Hysterectomy.  2. Cataract surgery.  3. Cholecystectomy.   ALLERGIES:  None.   Denies tobacco.  She has approximately 4 glasses of wine daily.   PRIMARY CARE [place]:  Dr. Leanna Battles.   MEDICATIONS PRIOR TO ADMISSION:  1. Toprol-XL 25 mg daily.  2. Hydrochlorothiazide 25 mg daily.  3. Lipitor 10 mg daily.  4. Aspirin 81 mg daily.  5. Multivitamin daily.   SOCIAL HISTORY:  Widowed.  Lives alone in [place].  One-level home.  Three steps to entry.  Virtually no local support.   HOSPITAL COURSE:  The patient with progressive gains while on rehab  services  with therapies initiated on a daily basis.  The following issues are  followed during the patient's rehab course:  Pertaining to Jacqueline Li' left  total knee arthroplasty, surgical site healing nicely.  No signs of  infection.  Steri-Strips remained in place.  CPM machine at 85 degrees.  She  would follow up with Dr. Gaynelle Arabian in approximately 2 weeks.  She was  ambulating an extended distance with weight bearing as tolerated with a  walker.  Pain management ongoing with the use of oxycodone and good results.  She remained on Coumadin for deep vein thrombosis prophylaxis.  Latest INR  of 2.1.  She would receive home health nursing per [facility] to complete  Coumadin protocol.  Her blood pressures remained controlled on Toprol with  no orthostatic changes.  She had no bowel or bladder disturbances.  She was  discharged to home.   Latest labs showed an INR of 2.1, hemoglobin 10.6, hematocrit 30.7.  Sodium  136, potassium 4, BUN 5, creatinine 0.7.   DISCHARGE MEDICATIONS AT TIME OF DICTATION:  1. Coumadin latest dose of 6 mg to be completed Jul 31, 2003.  2. Multivitamin daily.  3. Toprol-XL 25 mg daily.  4. Zocor 20 mg daily.  5. Oxycodone as needed pain.   ACTIVITY:  As tolerated.   DIET:  Regular.   WOUND CARE:  Cleanse incision daily with warm soap and water.   SPECIAL INSTRUCTIONS:  Home health physical and occupational therapy.  Home  health nurse per [facility]  Home health agency to complete Coumadin protocol.  The patient was advised to resume her aspirin therapy after Coumadin  completed.  She would follow up with Dr. Pilar Plate Aluisio in 2 weeks.      Lauraine Rinne, P.A.                     Jarvis Morgan, M.D.    DA/MEDQ  D:  07/09/2003  T:  07/09/2003  Job:  [id number]   cc:   Gaynelle Arabian, M.D.  [facility]  [address]  [place] [address]  Fax: [place]. Philip Aspen, M.D.  [address]  [place]  [place]  [address]  Fax: [phone]

## 2010-07-31 NOTE — H&P (Signed)
NAMEDAY, VACCHIANO NO.:  [id number]   MEDICAL RECORD NO.:  [id number]          PATIENT TYPE:  ORB   LOCATION:  [id number]                         FACILITY:  [facility]   PHYSICIAN:  Meredith Staggers, M.D.DATE OF BIRTH:  1927-10-28   DATE OF ADMISSION:  03/25/2005  DATE OF DISCHARGE:                                HISTORY & PHYSICAL   HISTORY OF PRESENT ILLNESS:  Ms. Afolabi is a 77 year old adult female with a  history of DJD, who underwent a left total knee replacement in 2005 and was  on the subacute unit at that time.   She has developed progressive arthritic pain of the right knee that has  failed conservative treatment.  She underwent a right total knee  replacement, March 22, 2005, by Dr. Wynelle Link.  Postoperatively, she was  allowed weightbearing as tolerated and she is on Coumadin for DVT  prophylaxis with an INR of 1.2 today.  She is being mobilized by Physical  and Occupational Therapy.   She was judged to be an appropriate candidate for SACU-level therapies and  is moved to the subacute unit for that purpose today.   REVIEW OF SYSTEMS:  Positive for stress incontinence, wound, frequency and  nocturia x2.   PAST MEDICAL HISTORY:  1.  History of left total knee replacement, April 2005.  2.  Hysterectomy.  3.  Cholecystectomy.  4.  Tonsillectomy and adenoidectomy.  5.  Excision of cataracts.  6.  Dyslipidemia.  7.  Allergic rhinitis.  8.  Excision of cyst of the left hand.  9.  Asthma.   FAMILY HISTORY:  Family history positive for diabetes mellitus.   SOCIAL HISTORY:  The patient lives alone in a 1-level home with no steps to  entry.  She was independent prior to admission.  She does do her own  housework.  She does not use tobacco and reports to a rare glass of wine  weekly or twice weekly.   FUNCTIONAL HISTORY:  Independent prior to admission using a cane.   MEDICATIONS:  1.  Astelin nasal spray.  2.  Restasis eye drops.  3.  Advair.  4.   Lipitor 10 mg daily.  5.  Hydrochlorothiazide 25 mg daily.  6.  Clarinex 5 mg daily.  7.  Toprol-XL 25 mg daily.  8.  Aspirin 81 mg daily.  9.  Citrucel Plus two daily.  10. Centrum Silver daily.   ALLERGIES:  No known drug allergies.   RECENT LABORATORY DATA:  Hemoglobin of 10.8, hematocrit of 31.5, platelets  of 154,000 and white count of 10.4.  Recent sodium of 132, potassium of 3.7,  chloride 102, CO2 27, BUN 7, creatinine 0.7 and glucose 117.   PHYSICAL EXAMINATION:  GENERAL:  Reasonably well-appearing adult female  lying in bed in no acute discomfort.  HEENT:  Normocephalic and nontraumatic.  CARDIOVASCULAR:  Regular rate and rhythm, S1 and S2, without murmurs.  ABDOMEN:  Abdomen was soft and nontender with positive bowel sounds.  LUNGS:  Lungs were clear to auscultation bilaterally.  NEUROLOGICAL:  Alert and oriented x3.  Cranial nerves  II-XII were intact.  Bilateral upper extremities showed 5/5 strength throughout.  Bulk and tone  were normal.  Reflexes were 2+ and symmetric.  Sensation was intact to light  touch throughout.  Examination of the right lower extremity showed sutures  with Steri-Strips in place over the right knee.  There was minimal erythema  and some slight dried blood beneath the Steri-Strips.  Manual muscle testing  was not completed on the right lower extremity.  Manual muscle testing on  the left lower extremity showed 4+ to 5-/5 strength in hip flexion, knee  extension and ankle dorsiflexion.  Reflexes were 2+ and symmetric and  sensation was intact to light touch throughout the left lower extremity.  There is a well-healed scar in the left knee region.   IMPRESSION:  Status post right total knee replacement, postoperative day #3,  for severe osteoarthritis.   At the present time, the patient has impairments in activities of daily  living, transfers and ambulation related to her total knee replacement.  We  will admit her to the subacute unit for  daily therapies.   PLAN:  1.  Admit to the subacute unit for daily OT and PT to advance ADLs,      transfers and ambulation, along with 24-hour nursing care for      administration of medication and monitoring of vitals, dressing changes.  2.  Check admission labs in the morning including CBC and CMET.  3.  Add subcu Lovenox 30 mg q.12 h. until INR consistently greater than 2.0      on Coumadin.  4.  Monitor hypertension on hydrochlorothiazide and Lopressor.  5.  Monitor hypokalemia with supplementation as necessary.  6.  Monitor hyponatremia with admission labs in the morning.  7.  Continue Clarinex along with Astelin and Advair as ordered for allergic      rhinitis/allergies.  8.  Continue oxycodone 5 mg one to two tablets p.o. q.4-6 h. p.r.n. for pain      relief.   GOALS:  Modified independent ADLs, transfers and ambulation.   ESTIMATED LENGTH OF STAY:  Four to 6 days.   PROGNOSIS:  Excellent.     ______________________________  Jarvis Morgan, M.D.      Meredith Staggers, M.D.  Electronically Signed    DC/MEDQ  D:  03/25/2005  T:  03/26/2005  Job:  [id number]

## 2010-07-31 NOTE — Op Note (Signed)
. [facility]  Patient:    Jacqueline Li, Jacqueline Li Visit Number: [id number] MRN: [id number]          Service Type: DSU Location: [facility] Attending Physician:  Sheran Luz Dictated by:   Wynonia Sours, M.D. Proc. Date: 11/09/00 Adm. Date:  11/09/2000                             Operative Report  PREOPERATIVE DIAGNOSIS: Stenosing tenosynovitis mass, right ring finger.  POSTOPERATIVE DIAGNOSIS: Stenosing tenosynovitis mass, right ring finger.  OPERATION/PROCEDURE:  1. Release of A1 pulley.  2. Excision of fibroma of right ring finger.  SURGEON: Wynonia Sours, M.D.  ASSISTANT: Odessa Fleming, R.N.  ANESTHESIA: Forearm-based IV regional.  ANESTHESIOLOGIST: Jessy Oto. Albertina Parr, M.D.  INDICATIONS FOR PROCEDURE: The patient is a 77 year old female, with a history of stenosing tenosynovitis of her right ring finger and development of a mass proximal to the A1 pulley.  DESCRIPTION OF PROCEDURE: The patient was brought to the operating room and a forearm-based IV regional anesthetic was carried out without difficulty.  She was prepped and draped using Betadine scrubbing solution with the right arm free.  A Brunner type incision was made and carried down through subcutaneous tissue.  Bleeders were electrocauterized.  This was centered over the metacarpal head proximally to the metacarpophalangeal joint and palm crease, carried down through subcutaneous tissue, and a fibroma in the palmar fascia was identified.  This was excised.  The dissection was carried distally and neurovascular bundles identified and protected.  The A1 pulley was identified and released on its radial aspect.  A small incision was made in the A2 pulley and with the finger placed through full range of motion no further triggering was evident.  The wound was irrigated and the skin closed with interrupted 5-0 nylon sutures.  A sterile compressive dressing was applied.  The patient tolerated  the procedure well and was taken to the recovery room for observation in satisfactory condition.  She is discharged home to return to the [facility] in one week, on Vicodin and Keflex. Dictated by:   Wynonia Sours, M.D. Attending Physician:  Sheran Luz DD:  11/09/00 TD:  11/09/00 Job: [id number]

## 2011-05-13 DIAGNOSIS — M6530 Trigger finger, unspecified finger: Secondary | ICD-10-CM | POA: Diagnosis not present

## 2011-06-07 DIAGNOSIS — M6530 Trigger finger, unspecified finger: Secondary | ICD-10-CM | POA: Diagnosis not present

## 2011-06-21 DIAGNOSIS — R269 Unspecified abnormalities of gait and mobility: Secondary | ICD-10-CM | POA: Diagnosis not present

## 2011-06-21 DIAGNOSIS — E785 Hyperlipidemia, unspecified: Secondary | ICD-10-CM | POA: Diagnosis not present

## 2011-06-21 DIAGNOSIS — I10 Essential (primary) hypertension: Secondary | ICD-10-CM | POA: Diagnosis not present

## 2011-07-13 DIAGNOSIS — R030 Elevated blood-pressure reading, without diagnosis of hypertension: Secondary | ICD-10-CM | POA: Diagnosis not present

## 2011-07-14 DIAGNOSIS — R030 Elevated blood-pressure reading, without diagnosis of hypertension: Secondary | ICD-10-CM | POA: Diagnosis not present

## 2011-08-11 DIAGNOSIS — J45909 Unspecified asthma, uncomplicated: Secondary | ICD-10-CM | POA: Diagnosis not present

## 2011-08-11 DIAGNOSIS — J301 Allergic rhinitis due to pollen: Secondary | ICD-10-CM | POA: Diagnosis not present

## 2011-08-11 DIAGNOSIS — J3089 Other allergic rhinitis: Secondary | ICD-10-CM | POA: Diagnosis not present

## 2011-10-14 DIAGNOSIS — I10 Essential (primary) hypertension: Secondary | ICD-10-CM | POA: Diagnosis not present

## 2011-10-14 DIAGNOSIS — E785 Hyperlipidemia, unspecified: Secondary | ICD-10-CM | POA: Diagnosis not present

## 2011-10-14 DIAGNOSIS — R82998 Other abnormal findings in urine: Secondary | ICD-10-CM | POA: Diagnosis not present

## 2011-10-15 DIAGNOSIS — T148 Other injury of unspecified body region: Secondary | ICD-10-CM | POA: Diagnosis not present

## 2011-10-15 DIAGNOSIS — W57XXXA Bitten or stung by nonvenomous insect and other nonvenomous arthropods, initial encounter: Secondary | ICD-10-CM | POA: Diagnosis not present

## 2011-10-28 DIAGNOSIS — M7660 Achilles tendinitis, unspecified leg: Secondary | ICD-10-CM | POA: Diagnosis not present

## 2011-10-28 DIAGNOSIS — Z0000 Encounter for general adult medical examination without abnormal findings: Secondary | ICD-10-CM | POA: Diagnosis not present

## 2011-10-28 DIAGNOSIS — E669 Obesity, unspecified: Secondary | ICD-10-CM | POA: Diagnosis not present

## 2011-10-28 DIAGNOSIS — I10 Essential (primary) hypertension: Secondary | ICD-10-CM | POA: Diagnosis not present

## 2011-11-12 DIAGNOSIS — M7660 Achilles tendinitis, unspecified leg: Secondary | ICD-10-CM | POA: Diagnosis not present

## 2011-11-23 DIAGNOSIS — M7660 Achilles tendinitis, unspecified leg: Secondary | ICD-10-CM | POA: Diagnosis not present

## 2011-11-25 DIAGNOSIS — M7660 Achilles tendinitis, unspecified leg: Secondary | ICD-10-CM | POA: Diagnosis not present

## 2011-11-30 DIAGNOSIS — M7660 Achilles tendinitis, unspecified leg: Secondary | ICD-10-CM | POA: Diagnosis not present

## 2011-12-03 DIAGNOSIS — M7660 Achilles tendinitis, unspecified leg: Secondary | ICD-10-CM | POA: Diagnosis not present

## 2011-12-08 DIAGNOSIS — M7660 Achilles tendinitis, unspecified leg: Secondary | ICD-10-CM | POA: Diagnosis not present

## 2011-12-17 DIAGNOSIS — M7660 Achilles tendinitis, unspecified leg: Secondary | ICD-10-CM | POA: Diagnosis not present

## 2011-12-24 DIAGNOSIS — M7660 Achilles tendinitis, unspecified leg: Secondary | ICD-10-CM | POA: Diagnosis not present

## 2012-02-03 ENCOUNTER — Emergency Department (HOSPITAL_COMMUNITY)
Admission: EM | Admit: 2012-02-03 | Discharge: 2012-02-04 | Disposition: A | Discharge status: Home / Self Care | Payer: Medicare Other | Attending: Emergency Medicine | Admitting: Emergency Medicine

## 2012-02-03 ENCOUNTER — Encounter (HOSPITAL_COMMUNITY): Payer: Self-pay | Admitting: *Deleted

## 2012-02-03 DIAGNOSIS — R4182 Altered mental status, unspecified: Secondary | ICD-10-CM | POA: Insufficient documentation

## 2012-02-03 DIAGNOSIS — R7401 Elevation of levels of liver transaminase levels: Secondary | ICD-10-CM

## 2012-02-03 DIAGNOSIS — F10229 Alcohol dependence with intoxication, unspecified: Secondary | ICD-10-CM

## 2012-02-03 DIAGNOSIS — E785 Hyperlipidemia, unspecified: Secondary | ICD-10-CM | POA: Insufficient documentation

## 2012-02-03 DIAGNOSIS — F1010 Alcohol abuse, uncomplicated: Secondary | ICD-10-CM | POA: Insufficient documentation

## 2012-02-03 DIAGNOSIS — F10929 Alcohol use, unspecified with intoxication, unspecified: Secondary | ICD-10-CM

## 2012-02-03 DIAGNOSIS — N390 Urinary tract infection, site not specified: Secondary | ICD-10-CM | POA: Insufficient documentation

## 2012-02-03 DIAGNOSIS — E871 Hypo-osmolality and hyponatremia: Secondary | ICD-10-CM | POA: Insufficient documentation

## 2012-02-03 DIAGNOSIS — R7402 Elevation of levels of lactic acid dehydrogenase (LDH): Secondary | ICD-10-CM | POA: Insufficient documentation

## 2012-02-03 DIAGNOSIS — I10 Essential (primary) hypertension: Secondary | ICD-10-CM | POA: Insufficient documentation

## 2012-02-03 DIAGNOSIS — Z79899 Other long term (current) drug therapy: Secondary | ICD-10-CM | POA: Insufficient documentation

## 2012-02-03 HISTORY — DX: Hyperlipidemia, unspecified: E78.5

## 2012-02-03 HISTORY — DX: Essential (primary) hypertension: I10

## 2012-02-03 NOTE — ED Notes (Signed)
EMS called to home.  Found patient laying on floor.  Family states that patient was Acting abnormal.  Family states that patient had drank a bottle of wine prior to calling EMS

## 2012-02-04 ENCOUNTER — Emergency Department (HOSPITAL_COMMUNITY): Payer: Medicare Other

## 2012-02-04 DIAGNOSIS — R4182 Altered mental status, unspecified: Secondary | ICD-10-CM | POA: Diagnosis not present

## 2012-02-04 DIAGNOSIS — M47812 Spondylosis without myelopathy or radiculopathy, cervical region: Secondary | ICD-10-CM | POA: Diagnosis not present

## 2012-02-04 LAB — URINALYSIS, ROUTINE W REFLEX MICROSCOPIC
Bilirubin Urine: NEGATIVE
Glucose, UA: NEGATIVE mg/dL
Hgb urine dipstick: NEGATIVE
Ketones, ur: NEGATIVE mg/dL
Nitrite: NEGATIVE
Protein, ur: NEGATIVE mg/dL
Specific Gravity, Urine: 1.015 (ref 1.005–1.030)
Urobilinogen, UA: 0.2 mg/dL (ref 0.0–1.0)
pH: 5.5 (ref 5.0–8.0)

## 2012-02-04 LAB — COMPREHENSIVE METABOLIC PANEL
ALT: 53 U/L — ABNORMAL HIGH (ref 0–35)
AST: 82 U/L — ABNORMAL HIGH (ref 0–37)
Albumin: 3.5 g/dL (ref 3.5–5.2)
Alkaline Phosphatase: 53 U/L (ref 39–117)
BUN: 11 mg/dL (ref 6–23)
CO2: 23 mEq/L (ref 19–32)
Calcium: 8.4 mg/dL (ref 8.4–10.5)
Chloride: 95 mEq/L — ABNORMAL LOW (ref 96–112)
Creatinine, Ser: 0.68 mg/dL (ref 0.50–1.10)
GFR calc Af Amer: >90 mL/min (ref >90)
GFR calc non Af Amer: 79 mL/min — ABNORMAL LOW (ref >90)
Glucose, Bld: 113 mg/dL — ABNORMAL HIGH (ref 70–99)
Potassium: 3.4 mEq/L — ABNORMAL LOW (ref 3.5–5.1)
Sodium: 131 mEq/L — ABNORMAL LOW (ref 135–145)
Total Bilirubin: 0.3 mg/dL (ref 0.3–1.2)
Total Protein: 6.3 g/dL (ref 6.0–8.3)

## 2012-02-04 LAB — CBC WITH DIFFERENTIAL/PLATELET
Basophils Absolute: 0 10*3/uL (ref 0.0–0.1)
Basophils Relative: 0 % (ref 0–1)
Eosinophils Absolute: 0.2 10*3/uL (ref 0.0–0.7)
Eosinophils Relative: 3 % (ref 0–5)
HCT: 38.3 % (ref 36.0–46.0)
Hemoglobin: 13.1 g/dL (ref 12.0–15.0)
Lymphocytes Relative: 41 % (ref 12–46)
Lymphs Abs: 2.8 10*3/uL (ref 0.7–4.0)
MCH: 31.3 pg (ref 26.0–34.0)
MCHC: 34.2 g/dL (ref 30.0–36.0)
MCV: 91.6 fL (ref 78.0–100.0)
Monocytes Absolute: 0.5 10*3/uL (ref 0.1–1.0)
Monocytes Relative: 8 % (ref 3–12)
Neutro Abs: 3.2 10*3/uL (ref 1.7–7.7)
Neutrophils Relative %: 48 % (ref 43–77)
Platelets: 167 10*3/uL (ref 150–400)
RBC: 4.18 MIL/uL (ref 3.87–5.11)
RDW: 13 % (ref 11.5–15.5)
WBC: 6.8 10*3/uL (ref 4.0–10.5)

## 2012-02-04 LAB — URINE MICROSCOPIC-ADD ON

## 2012-02-04 LAB — GLUCOSE, CAPILLARY: Glucose-Capillary: 115 mg/dL — ABNORMAL HIGH (ref 70–99)

## 2012-02-04 LAB — ETHANOL: Alcohol, Ethyl (B): 257 mg/dL — ABNORMAL HIGH (ref 0–11)

## 2012-02-04 LAB — RAPID URINE DRUG SCREEN, HOSP PERFORMED
Amphetamines: NOT DETECTED
Barbiturates: NOT DETECTED
Benzodiazepines: NOT DETECTED
Cocaine: NOT DETECTED
Opiates: NOT DETECTED
Tetrahydrocannabinol: NOT DETECTED

## 2012-02-04 LAB — AMMONIA: Ammonia: 28 umol/L (ref 11–60)

## 2012-02-04 MED ORDER — SULFAMETHOXAZOLE-TMP DS 800-160 MG PO TABS: 1.0000 | ORAL_TABLET | Freq: Once | ORAL | Status: DC

## 2012-02-04 MED ORDER — SULFAMETHOXAZOLE-TRIMETHOPRIM 800-160 MG PO TABS: 1.0000 | ORAL_TABLET | Freq: Two times a day (BID) | ORAL | Status: DC

## 2012-02-04 MED ORDER — VITAMIN B-1 100 MG PO TABS
100.0000 mg | ORAL_TABLET | Freq: Once | ORAL | Status: AC
  Administered 2012-02-04: 100 mg via ORAL
  Filled 2012-02-04: qty 1

## 2012-02-04 MED ORDER — SODIUM CHLORIDE 0.9 % IV BOLUS (SEPSIS)
1000.0000 mL | Freq: Once | INTRAVENOUS | Status: AC
  Administered 2012-02-04: 1000 mL via INTRAVENOUS

## 2012-02-04 MED ORDER — ADULT MULTIVITAMIN W/MINERALS CH
1.0000 | ORAL_TABLET | Freq: Once | ORAL | Status: AC
  Administered 2012-02-04: 1 via ORAL
  Filled 2012-02-04: qty 1

## 2012-02-04 MED ORDER — FOLIC ACID 1 MG PO TABS
1.0000 mg | ORAL_TABLET | Freq: Once | ORAL | Status: AC
  Administered 2012-02-04: 1 mg via ORAL
  Filled 2012-02-04: qty 1

## 2012-02-04 NOTE — ED Provider Notes (Addendum)
History     CSN: [id number]  Arrival date & time 02/03/12  2339   First MD Initiated Contact with Patient 02/04/12 0010      Chief Complaint  Patient presents with  . Alcohol Intoxication    (Consider location/radiation/quality/duration/timing/severity/associated sxs/prior treatment) HPI 78 year old female presents to emergency room at [facility] with reported alcohol intoxication. It is reported patient drank a bottle of wine tonight. It is unknown if she is a chronic drinker. Patient was found on the floor by EMS. It is unknown if she fell. Patient is oriented to herself and her birth date, but answers "I don't know" to all other questions.  Past Medical History  Diagnosis Date  . Hypertension   . Hyperlipidemia     Past Surgical History  Procedure Date  . Knee surgery     History reviewed. No pertinent family history.  History  Substance Use Topics  . Smoking status: Not on file  . Smokeless tobacco: Not on file  . Alcohol Use: Not on file    OB History    Grav Para Term Preterm Abortions TAB SAB Ect Mult Living                  Review of Systems  Unable to perform ROS: Other   intoxication  Allergies  Review of patient's allergies indicates no known allergies.  Home Medications   Current Outpatient Rx  Name  Route  Sig  Dispense  Refill  . AZELASTINE HCL 0.15 % NA SOLN   Nasal   Place 1-2 sprays into the nose daily as needed. For seasonal allergies         . BUDESONIDE-FORMOTEROL FUMARATE 80-4.5 MCG/ACT IN AERO   Inhalation   Inhale 2 puffs into the lungs 2 (two) times daily. scheduled         . LOSARTAN POTASSIUM-HCTZ 100-25 MG PO TABS   Oral   Take 1 tablet by mouth daily.         Marland Kitchen METOPROLOL SUCCINATE ER 25 MG PO TB24   Oral   Take 25 mg by mouth daily.         Marland Kitchen MONTELUKAST SODIUM 10 MG PO TABS   Oral   Take 10 mg by mouth daily.         Marland Kitchen PRAVASTATIN SODIUM 80 MG PO TABS   Oral   Take 80 mg by mouth daily.            There were no vitals taken for this visit.  Physical Exam  Nursing note and vitals reviewed. Constitutional: She appears well-developed and well-nourished. No distress.  HENT:  Head: Normocephalic and atraumatic.  Right Ear: External ear normal.  Left Ear: External ear normal.  Nose: Nose normal.  Mouth/Throat: Oropharynx is clear and moist. No oropharyngeal exudate.       Strong smell of alcohol noted  Eyes: Conjunctivae normal and EOM are normal. Pupils are equal, round, and reactive to light. No scleral icterus.  Neck: Normal range of motion. Neck supple. No JVD present. No tracheal deviation present. No thyromegaly present.  Cardiovascular: Normal rate, regular rhythm, normal heart sounds and intact distal pulses.  Exam reveals no gallop and no friction rub.   No murmur heard. Pulmonary/Chest: Effort normal and breath sounds normal. No stridor. No respiratory distress. She has no wheezes. She has no rales. She exhibits no tenderness.  Abdominal: Soft. Bowel sounds are normal. She exhibits no distension and no mass. There is no tenderness.  There is no rebound and no guarding.  Musculoskeletal: She exhibits edema. She exhibits no tenderness.  Lymphadenopathy:    She has no cervical adenopathy.  Neurological: She is alert. She displays normal reflexes. No cranial nerve deficit. She exhibits normal muscle tone. Coordination abnormal.       Patient with confusion, oriented to self and birth date. Does not know where she is does not know the year or the president. Patient with ataxia, suspected secondary to alcohol intoxication  Skin: Skin is warm and dry. No rash noted. She is not diaphoretic. No erythema. No pallor.    ED Course  Procedures (including critical care time)  Labs Reviewed  COMPREHENSIVE METABOLIC PANEL - Abnormal; Notable for the following:    Sodium 131 (*)     Potassium 3.4 (*)     Chloride 95 (*)     Glucose, Bld 113 (*)     AST 82 (*)     ALT 53 (*)      GFR calc non Af Amer 79 (*)     All other components within normal limits  ETHANOL - Abnormal; Notable for the following:    Alcohol, Ethyl (B) 257 (*)     All other components within normal limits  GLUCOSE, CAPILLARY - Abnormal; Notable for the following:    Glucose-Capillary 115 (*)     All other components within normal limits  CBC WITH DIFFERENTIAL  AMMONIA  URINALYSIS, ROUTINE W REFLEX MICROSCOPIC  URINE RAPID DRUG SCREEN (HOSP PERFORMED)   Ct Head Wo Contrast  02/04/2012  *RADIOLOGY REPORT*  Clinical Data:  Altered mental status  CT HEAD WITHOUT CONTRAST CT CERVICAL SPINE WITHOUT CONTRAST  Technique:  Multidetector CT imaging of the head and cervical spine was performed following the standard protocol without intravenous contrast.  Multiplanar CT image reconstructions of the cervical spine were also generated.  Comparison:   None  CT HEAD  Findings: Periventricular and subcortical white matter hypodensities are most in keeping with chronic microangiopathic change.There is no evidence for acute hemorrhage, hydrocephalus, mass lesion, or abnormal extra-axial fluid collection.  No definite CT evidence for acute infarction.  Postoperative changes of lens replacements. Punctate calcification along the lateral anterior aspect of the right globe is nonspecific. No displaced calvarial fracture. The visualized paranasal sinuses and mastoid air cells are predominately clear.  IMPRESSION: Nonspecific white matter changes, most often seen with chronic microangiopathic change.  Otherwise, no CT evidence of acute intracranial abnormality.  Punctate calcification along the lateral anterior aspect of the right globe is nonspecific, favor postsurgical.  CT CERVICAL SPINE  Findings: Interlobular septal thickening and tiny nodule in the right apex measuring 2 mm.  Maintained craniocervical relationship. No dens fracture.  Multilevel degenerative changes with disc height loss, osteophyte formation, and facet  arthropathy.  Central canal remains patent.  There is multilevel neural foraminal narrowing. Minimal anterolisthesis of C7 on T1.  No acute fracture. No aggressive osseous lesions.  IMPRESSION: Multilevel degenerative changes.  No acute fracture identified.  Mild anterolisthesis of C7-C1.   Original Report Authenticated By: Carlos Levering, M.D.    Ct Cervical Spine Wo Contrast  02/04/2012  *RADIOLOGY REPORT*  Clinical Data:  Altered mental status  CT HEAD WITHOUT CONTRAST CT CERVICAL SPINE WITHOUT CONTRAST  Technique:  Multidetector CT imaging of the head and cervical spine was performed following the standard protocol without intravenous contrast.  Multiplanar CT image reconstructions of the cervical spine were also generated.  Comparison:   None  CT HEAD  Findings: Periventricular and subcortical white matter hypodensities are most in keeping with chronic microangiopathic change.There is no evidence for acute hemorrhage, hydrocephalus, mass lesion, or abnormal extra-axial fluid collection.  No definite CT evidence for acute infarction.  Postoperative changes of lens replacements. Punctate calcification along the lateral anterior aspect of the right globe is nonspecific. No displaced calvarial fracture. The visualized paranasal sinuses and mastoid air cells are predominately clear.  IMPRESSION: Nonspecific white matter changes, most often seen with chronic microangiopathic change.  Otherwise, no CT evidence of acute intracranial abnormality.  Punctate calcification along the lateral anterior aspect of the right globe is nonspecific, favor postsurgical.  CT CERVICAL SPINE  Findings: Interlobular septal thickening and tiny nodule in the right apex measuring 2 mm.  Maintained craniocervical relationship. No dens fracture.  Multilevel degenerative changes with disc height loss, osteophyte formation, and facet arthropathy.  Central canal remains patent.  There is multilevel neural foraminal narrowing. Minimal  anterolisthesis of C7 on T1.  No acute fracture. No aggressive osseous lesions.  IMPRESSION: Multilevel degenerative changes.  No acute fracture identified.  Mild anterolisthesis of C7-C1.   Original Report Authenticated By: Carlos Levering, M.D.     Date: 02/04/2012  Rate: 66  Rhythm: normal sinus rhythm  QRS Axis: normal  Intervals: normal  ST/T Wave abnormalities: normal  Conduction Disutrbances:none  Narrative Interpretation:   Old EKG Reviewed: unchanged    1. Altered mental state   2. Alcohol intoxication with blood level 0.08-0.29   3. Hyponatremia   4. Transaminitis       MDM  78 year old female with altered mental status presumed alcohol intoxication found in the floor. CT scans without acute trauma, or stroke. Discussed case with pt's daughter, who reports she and patient been occasionally drinking heavily to "party". Her mother reported to her that she hadn't eaten today prior to drinking wine.  She feels that her mother has been under a lot of stress recently due to grandson and granddaughter in law moving into the house. She feels like her mother has been showing more signs of dementia over the last several months to year. She denies any trauma to the patient's head today. She reports the patient doesn't normally get so confused when they drink.  Will allow patient to sober and reassess.        Kalman Drape, MD 02/04/12 [place], MD 02/04/12 [phone]

## 2012-02-04 NOTE — ED Notes (Signed)
Vicie Brautigan (pt's daughter) can contacted at  [phone] or [phone]

## 2012-02-05 LAB — URINE CULTURE: Colony Count: 95000

## 2012-05-17 DIAGNOSIS — J45909 Unspecified asthma, uncomplicated: Secondary | ICD-10-CM | POA: Diagnosis not present

## 2012-05-17 DIAGNOSIS — J301 Allergic rhinitis due to pollen: Secondary | ICD-10-CM | POA: Diagnosis not present

## 2012-05-17 DIAGNOSIS — J3089 Other allergic rhinitis: Secondary | ICD-10-CM | POA: Diagnosis not present

## 2012-06-29 DIAGNOSIS — Z6834 Body mass index (BMI) 34.0-34.9, adult: Secondary | ICD-10-CM | POA: Diagnosis not present

## 2012-06-29 DIAGNOSIS — F0280 Dementia in other diseases classified elsewhere without behavioral disturbance: Secondary | ICD-10-CM | POA: Diagnosis not present

## 2012-06-29 DIAGNOSIS — I10 Essential (primary) hypertension: Secondary | ICD-10-CM | POA: Diagnosis not present

## 2012-06-29 DIAGNOSIS — G309 Alzheimer's disease, unspecified: Secondary | ICD-10-CM | POA: Diagnosis not present

## 2012-06-29 DIAGNOSIS — R269 Unspecified abnormalities of gait and mobility: Secondary | ICD-10-CM | POA: Diagnosis not present

## 2012-08-24 DIAGNOSIS — I10 Essential (primary) hypertension: Secondary | ICD-10-CM | POA: Diagnosis not present

## 2012-08-24 DIAGNOSIS — Z6834 Body mass index (BMI) 34.0-34.9, adult: Secondary | ICD-10-CM | POA: Diagnosis not present

## 2012-08-24 DIAGNOSIS — M1710 Unilateral primary osteoarthritis, unspecified knee: Secondary | ICD-10-CM | POA: Diagnosis not present

## 2012-08-24 DIAGNOSIS — R269 Unspecified abnormalities of gait and mobility: Secondary | ICD-10-CM | POA: Diagnosis not present

## 2012-08-24 DIAGNOSIS — IMO0002 Reserved for concepts with insufficient information to code with codable children: Secondary | ICD-10-CM | POA: Diagnosis not present

## 2012-08-24 DIAGNOSIS — F0280 Dementia in other diseases classified elsewhere without behavioral disturbance: Secondary | ICD-10-CM | POA: Diagnosis not present

## 2012-08-24 DIAGNOSIS — G309 Alzheimer's disease, unspecified: Secondary | ICD-10-CM | POA: Diagnosis not present

## 2012-11-01 DIAGNOSIS — R82998 Other abnormal findings in urine: Secondary | ICD-10-CM | POA: Diagnosis not present

## 2012-11-01 DIAGNOSIS — E785 Hyperlipidemia, unspecified: Secondary | ICD-10-CM | POA: Diagnosis not present

## 2012-11-01 DIAGNOSIS — I10 Essential (primary) hypertension: Secondary | ICD-10-CM | POA: Diagnosis not present

## 2012-11-01 DIAGNOSIS — N390 Urinary tract infection, site not specified: Secondary | ICD-10-CM | POA: Diagnosis not present

## 2012-11-08 DIAGNOSIS — Z0000 Encounter for general adult medical examination without abnormal findings: Secondary | ICD-10-CM | POA: Diagnosis not present

## 2012-11-08 DIAGNOSIS — R269 Unspecified abnormalities of gait and mobility: Secondary | ICD-10-CM | POA: Diagnosis not present

## 2012-11-08 DIAGNOSIS — J45909 Unspecified asthma, uncomplicated: Secondary | ICD-10-CM | POA: Diagnosis not present

## 2012-11-08 DIAGNOSIS — E669 Obesity, unspecified: Secondary | ICD-10-CM | POA: Diagnosis not present

## 2012-11-08 DIAGNOSIS — F0280 Dementia in other diseases classified elsewhere without behavioral disturbance: Secondary | ICD-10-CM | POA: Diagnosis not present

## 2012-11-08 DIAGNOSIS — Z23 Encounter for immunization: Secondary | ICD-10-CM | POA: Diagnosis not present

## 2012-11-08 DIAGNOSIS — Z79899 Other long term (current) drug therapy: Secondary | ICD-10-CM | POA: Diagnosis not present

## 2012-11-08 DIAGNOSIS — I10 Essential (primary) hypertension: Secondary | ICD-10-CM | POA: Diagnosis not present

## 2012-11-08 DIAGNOSIS — E785 Hyperlipidemia, unspecified: Secondary | ICD-10-CM | POA: Diagnosis not present

## 2014-03-13 DIAGNOSIS — I10 Essential (primary) hypertension: Secondary | ICD-10-CM | POA: Diagnosis not present

## 2014-03-13 DIAGNOSIS — E785 Hyperlipidemia, unspecified: Secondary | ICD-10-CM | POA: Diagnosis not present

## 2014-03-13 DIAGNOSIS — N390 Urinary tract infection, site not specified: Secondary | ICD-10-CM | POA: Diagnosis not present

## 2014-03-13 DIAGNOSIS — Z008 Encounter for other general examination: Secondary | ICD-10-CM | POA: Diagnosis not present

## 2014-03-19 DIAGNOSIS — Z1389 Encounter for screening for other disorder: Secondary | ICD-10-CM | POA: Diagnosis not present

## 2014-03-19 DIAGNOSIS — M545 Low back pain: Secondary | ICD-10-CM | POA: Diagnosis not present

## 2014-03-19 DIAGNOSIS — Z6835 Body mass index (BMI) 35.0-35.9, adult: Secondary | ICD-10-CM | POA: Diagnosis not present

## 2014-03-19 DIAGNOSIS — J45909 Unspecified asthma, uncomplicated: Secondary | ICD-10-CM | POA: Diagnosis not present

## 2014-03-19 DIAGNOSIS — G309 Alzheimer's disease, unspecified: Secondary | ICD-10-CM | POA: Diagnosis not present

## 2014-03-19 DIAGNOSIS — R269 Unspecified abnormalities of gait and mobility: Secondary | ICD-10-CM | POA: Diagnosis not present

## 2014-03-19 DIAGNOSIS — G4733 Obstructive sleep apnea (adult) (pediatric): Secondary | ICD-10-CM | POA: Diagnosis not present

## 2014-03-19 DIAGNOSIS — I10 Essential (primary) hypertension: Secondary | ICD-10-CM | POA: Diagnosis not present

## 2014-03-19 DIAGNOSIS — Z0000 Encounter for general adult medical examination without abnormal findings: Secondary | ICD-10-CM | POA: Diagnosis not present

## 2014-03-19 DIAGNOSIS — E785 Hyperlipidemia, unspecified: Secondary | ICD-10-CM | POA: Diagnosis not present

## 2014-03-21 ENCOUNTER — Encounter: Payer: Self-pay | Admitting: Internal Medicine

## 2015-04-14 DIAGNOSIS — E784 Other hyperlipidemia: Secondary | ICD-10-CM | POA: Diagnosis not present

## 2015-04-14 DIAGNOSIS — N390 Urinary tract infection, site not specified: Secondary | ICD-10-CM | POA: Diagnosis not present

## 2015-04-14 DIAGNOSIS — I10 Essential (primary) hypertension: Secondary | ICD-10-CM | POA: Diagnosis not present

## 2015-04-14 DIAGNOSIS — R8299 Other abnormal findings in urine: Secondary | ICD-10-CM | POA: Diagnosis not present

## 2015-04-21 DIAGNOSIS — G308 Other Alzheimer's disease: Secondary | ICD-10-CM | POA: Diagnosis not present

## 2015-04-21 DIAGNOSIS — G4733 Obstructive sleep apnea (adult) (pediatric): Secondary | ICD-10-CM | POA: Diagnosis not present

## 2015-04-21 DIAGNOSIS — Z1389 Encounter for screening for other disorder: Secondary | ICD-10-CM | POA: Diagnosis not present

## 2015-04-21 DIAGNOSIS — Z0000 Encounter for general adult medical examination without abnormal findings: Secondary | ICD-10-CM | POA: Diagnosis not present

## 2015-04-21 DIAGNOSIS — E668 Other obesity: Secondary | ICD-10-CM | POA: Diagnosis not present

## 2015-04-21 DIAGNOSIS — I10 Essential (primary) hypertension: Secondary | ICD-10-CM | POA: Diagnosis not present

## 2015-04-21 DIAGNOSIS — Z6834 Body mass index (BMI) 34.0-34.9, adult: Secondary | ICD-10-CM | POA: Diagnosis not present

## 2015-04-21 DIAGNOSIS — J45998 Other asthma: Secondary | ICD-10-CM | POA: Diagnosis not present

## 2015-04-21 DIAGNOSIS — E784 Other hyperlipidemia: Secondary | ICD-10-CM | POA: Diagnosis not present

## 2015-04-21 DIAGNOSIS — H9193 Unspecified hearing loss, bilateral: Secondary | ICD-10-CM | POA: Diagnosis not present

## 2016-04-15 DIAGNOSIS — E784 Other hyperlipidemia: Secondary | ICD-10-CM | POA: Diagnosis not present

## 2016-04-15 DIAGNOSIS — R8299 Other abnormal findings in urine: Secondary | ICD-10-CM | POA: Diagnosis not present

## 2016-04-15 DIAGNOSIS — I10 Essential (primary) hypertension: Secondary | ICD-10-CM | POA: Diagnosis not present

## 2016-04-22 DIAGNOSIS — E668 Other obesity: Secondary | ICD-10-CM | POA: Diagnosis not present

## 2016-04-22 DIAGNOSIS — I10 Essential (primary) hypertension: Secondary | ICD-10-CM | POA: Diagnosis not present

## 2016-04-22 DIAGNOSIS — Z6833 Body mass index (BMI) 33.0-33.9, adult: Secondary | ICD-10-CM | POA: Diagnosis not present

## 2016-04-22 DIAGNOSIS — E784 Other hyperlipidemia: Secondary | ICD-10-CM | POA: Diagnosis not present

## 2016-04-22 DIAGNOSIS — Z0000 Encounter for general adult medical examination without abnormal findings: Secondary | ICD-10-CM | POA: Diagnosis not present

## 2016-04-22 DIAGNOSIS — R2689 Other abnormalities of gait and mobility: Secondary | ICD-10-CM | POA: Diagnosis not present

## 2016-04-22 DIAGNOSIS — J45998 Other asthma: Secondary | ICD-10-CM | POA: Diagnosis not present

## 2016-04-22 DIAGNOSIS — Z1389 Encounter for screening for other disorder: Secondary | ICD-10-CM | POA: Diagnosis not present

## 2016-04-22 DIAGNOSIS — G4733 Obstructive sleep apnea (adult) (pediatric): Secondary | ICD-10-CM | POA: Diagnosis not present

## 2016-04-22 DIAGNOSIS — G308 Other Alzheimer's disease: Secondary | ICD-10-CM | POA: Diagnosis not present

## 2016-04-22 DIAGNOSIS — M9906 Segmental and somatic dysfunction of lower extremity: Secondary | ICD-10-CM | POA: Diagnosis not present

## 2016-05-11 DIAGNOSIS — D485 Neoplasm of uncertain behavior of skin: Secondary | ICD-10-CM | POA: Diagnosis not present

## 2016-05-11 DIAGNOSIS — C44719 Basal cell carcinoma of skin of left lower limb, including hip: Secondary | ICD-10-CM | POA: Diagnosis not present

## 2017-04-18 DIAGNOSIS — E7849 Other hyperlipidemia: Secondary | ICD-10-CM | POA: Diagnosis not present

## 2017-04-18 DIAGNOSIS — I10 Essential (primary) hypertension: Secondary | ICD-10-CM | POA: Diagnosis not present

## 2017-04-18 DIAGNOSIS — R82998 Other abnormal findings in urine: Secondary | ICD-10-CM | POA: Diagnosis not present

## 2017-04-25 DIAGNOSIS — Z0000 Encounter for general adult medical examination without abnormal findings: Secondary | ICD-10-CM | POA: Diagnosis not present

## 2017-04-25 DIAGNOSIS — I10 Essential (primary) hypertension: Secondary | ICD-10-CM | POA: Diagnosis not present

## 2017-04-25 DIAGNOSIS — M545 Low back pain: Secondary | ICD-10-CM | POA: Diagnosis not present

## 2017-04-25 DIAGNOSIS — L988 Other specified disorders of the skin and subcutaneous tissue: Secondary | ICD-10-CM | POA: Diagnosis not present

## 2017-04-25 DIAGNOSIS — K5909 Other constipation: Secondary | ICD-10-CM | POA: Diagnosis not present

## 2017-04-25 DIAGNOSIS — G309 Alzheimer's disease, unspecified: Secondary | ICD-10-CM | POA: Diagnosis not present

## 2017-04-25 DIAGNOSIS — E7849 Other hyperlipidemia: Secondary | ICD-10-CM | POA: Diagnosis not present

## 2017-04-25 DIAGNOSIS — Z6834 Body mass index (BMI) 34.0-34.9, adult: Secondary | ICD-10-CM | POA: Diagnosis not present

## 2017-04-25 DIAGNOSIS — G4733 Obstructive sleep apnea (adult) (pediatric): Secondary | ICD-10-CM | POA: Diagnosis not present

## 2017-04-25 DIAGNOSIS — R2689 Other abnormalities of gait and mobility: Secondary | ICD-10-CM | POA: Diagnosis not present

## 2017-04-25 DIAGNOSIS — E668 Other obesity: Secondary | ICD-10-CM | POA: Diagnosis not present

## 2018-04-20 DIAGNOSIS — I10 Essential (primary) hypertension: Secondary | ICD-10-CM | POA: Diagnosis not present

## 2018-04-20 DIAGNOSIS — E7849 Other hyperlipidemia: Secondary | ICD-10-CM | POA: Diagnosis not present

## 2018-04-20 DIAGNOSIS — R82998 Other abnormal findings in urine: Secondary | ICD-10-CM | POA: Diagnosis not present

## 2018-04-27 DIAGNOSIS — J45998 Other asthma: Secondary | ICD-10-CM | POA: Diagnosis not present

## 2018-04-27 DIAGNOSIS — Z1331 Encounter for screening for depression: Secondary | ICD-10-CM | POA: Diagnosis not present

## 2018-04-27 DIAGNOSIS — G309 Alzheimer's disease, unspecified: Secondary | ICD-10-CM | POA: Diagnosis not present

## 2018-04-27 DIAGNOSIS — K5900 Constipation, unspecified: Secondary | ICD-10-CM | POA: Diagnosis not present

## 2018-04-27 DIAGNOSIS — E668 Other obesity: Secondary | ICD-10-CM | POA: Diagnosis not present

## 2018-04-27 DIAGNOSIS — R2689 Other abnormalities of gait and mobility: Secondary | ICD-10-CM | POA: Diagnosis not present

## 2018-04-27 DIAGNOSIS — E7849 Other hyperlipidemia: Secondary | ICD-10-CM | POA: Diagnosis not present

## 2018-04-27 DIAGNOSIS — Z6836 Body mass index (BMI) 36.0-36.9, adult: Secondary | ICD-10-CM | POA: Diagnosis not present

## 2018-04-27 DIAGNOSIS — H9193 Unspecified hearing loss, bilateral: Secondary | ICD-10-CM | POA: Diagnosis not present

## 2018-04-27 DIAGNOSIS — I10 Essential (primary) hypertension: Secondary | ICD-10-CM | POA: Diagnosis not present

## 2018-04-27 DIAGNOSIS — G4733 Obstructive sleep apnea (adult) (pediatric): Secondary | ICD-10-CM | POA: Diagnosis not present

## 2018-04-27 DIAGNOSIS — Z0000 Encounter for general adult medical examination without abnormal findings: Secondary | ICD-10-CM | POA: Diagnosis not present

## 2019-01-28 ENCOUNTER — Emergency Department (HOSPITAL_COMMUNITY): Payer: Medicare Other

## 2019-01-28 ENCOUNTER — Emergency Department (HOSPITAL_COMMUNITY)
Admission: EM | Admit: 2019-01-28 | Discharge: 2019-01-28 | Disposition: A | Discharge status: Home / Self Care | Payer: Medicare Other | Attending: Emergency Medicine | Admitting: Emergency Medicine

## 2019-01-28 ENCOUNTER — Other Ambulatory Visit: Payer: Self-pay

## 2019-01-28 ENCOUNTER — Encounter (HOSPITAL_COMMUNITY): Payer: Self-pay | Admitting: Emergency Medicine

## 2019-01-28 DIAGNOSIS — R5383 Other fatigue: Secondary | ICD-10-CM | POA: Diagnosis not present

## 2019-01-28 DIAGNOSIS — F0390 Unspecified dementia without behavioral disturbance: Secondary | ICD-10-CM | POA: Diagnosis not present

## 2019-01-28 DIAGNOSIS — R42 Dizziness and giddiness: Secondary | ICD-10-CM

## 2019-01-28 DIAGNOSIS — R55 Syncope and collapse: Secondary | ICD-10-CM | POA: Diagnosis not present

## 2019-01-28 DIAGNOSIS — R404 Transient alteration of awareness: Secondary | ICD-10-CM | POA: Diagnosis not present

## 2019-01-28 DIAGNOSIS — R410 Disorientation, unspecified: Secondary | ICD-10-CM | POA: Diagnosis not present

## 2019-01-28 DIAGNOSIS — I10 Essential (primary) hypertension: Secondary | ICD-10-CM | POA: Diagnosis not present

## 2019-01-28 DIAGNOSIS — R140 Abdominal distension (gaseous): Secondary | ICD-10-CM | POA: Diagnosis not present

## 2019-01-28 DIAGNOSIS — I959 Hypotension, unspecified: Secondary | ICD-10-CM | POA: Diagnosis not present

## 2019-01-28 DIAGNOSIS — Z79899 Other long term (current) drug therapy: Secondary | ICD-10-CM | POA: Diagnosis not present

## 2019-01-28 DIAGNOSIS — R4020 Unspecified coma: Secondary | ICD-10-CM | POA: Diagnosis not present

## 2019-01-28 LAB — AMMONIA: Ammonia: 14 umol/L (ref 9–35)

## 2019-01-28 LAB — COMPREHENSIVE METABOLIC PANEL
ALT: 89 U/L — ABNORMAL HIGH (ref 0–44)
AST: 51 U/L — ABNORMAL HIGH (ref 15–41)
Albumin: 2.6 g/dL — ABNORMAL LOW (ref 3.5–5.0)
Alkaline Phosphatase: 128 U/L — ABNORMAL HIGH (ref 38–126)
Anion gap: 13 (ref 5–15)
BUN: 19 mg/dL (ref 8–23)
CO2: 22 mmol/L (ref 22–32)
Calcium: 8.2 mg/dL — ABNORMAL LOW (ref 8.9–10.3)
Chloride: 100 mmol/L (ref 98–111)
Creatinine, Ser: 1.36 mg/dL — ABNORMAL HIGH (ref 0.44–1.00)
GFR calc Af Amer: 40 mL/min — ABNORMAL LOW (ref >60)
GFR calc non Af Amer: 34 mL/min — ABNORMAL LOW (ref >60)
Glucose, Bld: 187 mg/dL — ABNORMAL HIGH (ref 70–99)
Potassium: 3.9 mmol/L (ref 3.5–5.1)
Sodium: 135 mmol/L (ref 135–145)
Total Bilirubin: 0.5 mg/dL (ref 0.3–1.2)
Total Protein: 5.8 g/dL — ABNORMAL LOW (ref 6.5–8.1)

## 2019-01-28 LAB — CBC WITH DIFFERENTIAL/PLATELET
Abs Immature Granulocytes: 0.11 10*3/uL — ABNORMAL HIGH (ref 0.00–0.07)
Basophils Absolute: 0.1 10*3/uL (ref 0.0–0.1)
Basophils Relative: 0 %
Eosinophils Absolute: 0.2 10*3/uL (ref 0.0–0.5)
Eosinophils Relative: 1 %
HCT: 36.3 % (ref 36.0–46.0)
Hemoglobin: 11.4 g/dL — ABNORMAL LOW (ref 12.0–15.0)
Immature Granulocytes: 1 %
Lymphocytes Relative: 21 %
Lymphs Abs: 2.7 10*3/uL (ref 0.7–4.0)
MCH: 29.8 pg (ref 26.0–34.0)
MCHC: 31.4 g/dL (ref 30.0–36.0)
MCV: 95 fL (ref 80.0–100.0)
Monocytes Absolute: 1.2 10*3/uL — ABNORMAL HIGH (ref 0.1–1.0)
Monocytes Relative: 9 %
Neutro Abs: 8.9 10*3/uL — ABNORMAL HIGH (ref 1.7–7.7)
Neutrophils Relative %: 68 %
Platelets: 369 10*3/uL (ref 150–400)
RBC: 3.82 MIL/uL — ABNORMAL LOW (ref 3.87–5.11)
RDW: 13.2 % (ref 11.5–15.5)
WBC: 13.1 10*3/uL — ABNORMAL HIGH (ref 4.0–10.5)
nRBC: 0 % (ref 0.0–0.2)

## 2019-01-28 LAB — URINALYSIS, ROUTINE W REFLEX MICROSCOPIC
Bilirubin Urine: NEGATIVE
Glucose, UA: NEGATIVE mg/dL
Hgb urine dipstick: NEGATIVE
Ketones, ur: NEGATIVE mg/dL
Nitrite: NEGATIVE
Protein, ur: NEGATIVE mg/dL
Specific Gravity, Urine: 1.011 (ref 1.005–1.030)
pH: 5 (ref 5.0–8.0)

## 2019-01-28 LAB — LACTIC ACID, PLASMA
Lactic Acid, Venous: 2.1 mmol/L (ref 0.5–1.9)
Lactic Acid, Venous: 2.2 mmol/L (ref 0.5–1.9)

## 2019-01-28 LAB — PROTIME-INR
INR: 1.1 (ref 0.8–1.2)
Prothrombin Time: 14.1 seconds (ref 11.4–15.2)

## 2019-01-28 MED ORDER — LACTATED RINGERS IV BOLUS
500.0000 mL | Freq: Once | INTRAVENOUS | Status: AC
  Administered 2019-01-28: 500 mL via INTRAVENOUS

## 2019-01-28 NOTE — ED Triage Notes (Signed)
[name] from home. Pt family called out stating pt was weak and had a near syncopal episode. Pt initial BP with EMS was 124/70 after sitting up pt BP 26C/ systpolic. Pt with history of dementia. Pt abdomen appears distended, unknown when last BM was.

## 2019-01-28 NOTE — ED Notes (Signed)
Patient transported to CT 

## 2019-01-28 NOTE — ED Notes (Signed)
Pt transported to Xray. 

## 2019-01-28 NOTE — ED Provider Notes (Signed)
[facility] EM[facility] DEPART[facility] Provider Note   CSN: [id number] Arrival date & time: 01/28/19  1823     History   Chief Complaint Chief Complaint  Patient presents with  . Dizziness    HPI Jacqueline Li is a 85 y.o. female.     The history is provided by a relative and the EMS personnel.    Patient has a past medical history of dementia, hyperlipidemia and hypertension presenting today for increased fatigue and orthostatic dizziness. Symptoms have been present for the last three days. Associated symptoms include generalized weakness, decreased responsiveness (answering questions but appears "out of it" and loss of appetite. Nothing has been tried for symptoms, nothing seems to be better or worse. Patient denies any symptoms currently.  Past Medical History:  Diagnosis Date  . Hyperlipidemia   . Hypertension     There are no active problems to display for this patient.   Past Surgical History:  Procedure Laterality Date  . KNEE SURGERY       OB History   No obstetric history on file.      Home Medications    Prior to Admission medications   Medication Sig Start Date End Date Taking? Authorizing Provider  Azelastine HCl (ASTEPRO) 0.15 % SOLN Place 1-2 sprays into the nose daily as needed. For seasonal allergies    [provider]  budesonide-formoterol (SYMBICORT) 80-4.5 MCG/ACT inhaler Inhale 2 puffs into the lungs 2 (two) times daily. scheduled    [provider]  donepezil (ARICEPT) 5 MG tablet Take 5 mg by mouth daily. 01/09/19   [provider]  metoprolol succinate (TOPROL-XL) 50 MG 24 hr tablet Take 50 mg by mouth See admin instructions. Take one tablet (50 mg) by mouth every other day as needed 11/17/18   [provider]  montelukast (SINGULAIR) 10 MG tablet Take 10 mg by mouth daily.    [provider]  olmesartan-hydrochlorothiazide (BENICAR HCT) 40-12.5 MG tablet Take 1 tablet by mouth daily.  12/05/18   [provider]  pravastatin (PRAVACHOL) 40 MG tablet Take 40 mg by mouth daily. 12/05/18   [provider]    Family History History reviewed. No pertinent family history.  Social History Social History   Tobacco Use  . Smoking status: Never Smoker  . Smokeless tobacco: Never Used  Substance Use Topics  . Alcohol use: Not on file  . Drug use: Not on file     Allergies   Patient has no known allergies.   Review of Systems Review of Systems  Unable to perform ROS: Dementia     Physical Exam Updated Vital Signs BP (!) 114/48   Pulse 71   Temp (!) 97.4 F (36.3 C) (Oral)   Resp 20   Ht 5\' 6"  (1.676 m)   Wt 74.8 kg   SpO2 100%   BMI 26.63 kg/m   Physical Exam Vitals signs and nursing note reviewed.  Constitutional:      Appearance: She is well-developed. She is obese.     Comments: Pleasantly demented  HENT:     Head: Normocephalic and atraumatic.  Eyes:     Extraocular Movements: Extraocular movements intact.     Conjunctiva/sclera: Conjunctivae normal.  Neck:     Musculoskeletal: Neck supple.  Cardiovascular:     Rate and Rhythm: Normal rate and regular rhythm.     Heart sounds: No murmur.  Pulmonary:     Effort: Pulmonary effort is normal. No respiratory distress.  Breath sounds: Normal breath sounds.  Abdominal:     General: There is no distension.     Palpations: Abdomen is soft.     Tenderness: There is no abdominal tenderness. There is no guarding or rebound.     Comments: No distension, no abdominal TTP, no peritoneal signs  Skin:    General: Skin is warm and dry.     Comments: No trauma visible  Neurological:     General: No focal deficit present.     Mental Status: She is alert.     Cranial Nerves: No cranial nerve deficit.     Comments: Oriented to person      ED Treatments / Results  Labs (all labs ordered are listed, but only abnormal results are displayed) Labs Reviewed  CBC WITH  DIFFERENTIAL/PLATELET - Abnormal; Notable for the following components:      Result Value   WBC 13.1 (*)    RBC 3.82 (*)    Hemoglobin 11.4 (*)    Neutro Abs 8.9 (*)    Monocytes Absolute 1.2 (*)    Abs Immature Granulocytes 0.11 (*)    All other components within normal limits  COMPREHENSIVE METABOLIC PANEL - Abnormal; Notable for the following components:   Glucose, Bld 187 (*)    Creatinine, Ser 1.36 (*)    Calcium 8.2 (*)    Total Protein 5.8 (*)    Albumin 2.6 (*)    AST 51 (*)    ALT 89 (*)    Alkaline Phosphatase 128 (*)    GFR calc non Af Amer 34 (*)    GFR calc Af Amer 40 (*)    All other components within normal limits  LACTIC ACID, PLASMA - Abnormal; Notable for the following components:   Lactic Acid, Venous 2.1 (*)    All other components within normal limits  LACTIC ACID, PLASMA - Abnormal; Notable for the following components:   Lactic Acid, Venous 2.2 (*)    All other components within normal limits  PROTIME-INR  AMMONIA    EKG EKG Interpretation  Date/Time:  Sunday January 28 2019 18:33:51 EST Ventricular Rate:  66 PR Interval:    QRS Duration: 88 QT Interval:  437 QTC Calculation: 458 R Axis:   84 Text Interpretation: Sinus rhythm Atrial premature complex Borderline right axis deviation Borderline low voltage, extremity leads No significant change was found Confirmed by Gerlene Fee [id number]) on 01/28/2019 6:37:32 PM   Radiology Ct Head Wo Contrast  Result Date: 01/28/2019 CLINICAL DATA:  Altered level of consciousness. Weakness, near syncope. EXAM: CT HEAD WITHOUT CONTRAST TECHNIQUE: Contiguous axial images were obtained from the base of the skull through the vertex without intravenous contrast. COMPARISON:  None. FINDINGS: Brain: Patchy chronic small vessel disease throughout the deep white matter. No acute intracranial abnormality. Specifically, no hemorrhage, hydrocephalus, mass lesion, acute infarction, or significant intracranial injury.  Vascular: No hyperdense vessel or unexpected calcification. Skull: No acute calvarial abnormality. Sinuses/Orbits: Visualized paranasal sinuses and mastoids clear. Orbital soft tissues unremarkable. Other: None IMPRESSION: Patchy chronic small vessel disease. No acute intracranial abnormality. Electronically Signed   By: Rolm Baptise M.D.   On: 01/28/2019 20:36   Dg Abdomen Acute W/chest  Result Date: 01/28/2019 CLINICAL DATA:  Abdominal distention EXAM: DG ABDOMEN ACUTE W/ 1V CHEST COMPARISON:  Chest x-ray 01/30/2003 FINDINGS: Prior cholecystectomy. Gas throughout nondistended large and small bowel. No evidence of bowel obstruction, organomegaly or free air. No suspicious calcification. Heart is normal size. Left base atelectasis. Right  lung clear. No acute bony abnormality. IMPRESSION: No evidence of bowel obstruction or free air. Prior cholecystectomy. No acute cardiopulmonary disease. Electronically Signed   By: Rolm Baptise M.D.   On: 01/28/2019 22:01    Procedures Procedures (including critical care time)  Medications Ordered in ED Medications  lactated ringers bolus 500 mL (0 mLs Intravenous Stopped 01/28/19 2150)     Initial Impression / Assessment and Plan / ED Course  I have reviewed the triage vital signs and the nursing notes.  Pertinent labs & imaging results that were available during my care of the patient were reviewed by me and considered in my medical decision making (see chart for details).        Jacqueline Li is a 85 y.o. female with a past medical history of dementia, HLD, and HTN presenting today for a near syncopal episode when she stood up.  Labs and imaging ordered for differential diagnosis of intracranial bleeding/abnormality, UTI, electrolyte abnormality, anemia, infection, increased will burden.  Labs and imaging were significant for an elevated creatinine, unclear whether if this is old or new.  No significant leukocytosis, electrolyte abnormality present.   Mildly elevated LFTs.  Chest x-ray and acute abdominal series negative.  Bladder scan showed 350 cc of urine, and a UA showed no acute findings.  Patient stable for discharge at this time. PCP follow up recommended. Hydration recommended. Care of patient discussed with the supervising attending.  Final Clinical Impressions(s) / ED Diagnoses   Final diagnoses:  Dizziness    ED Discharge Orders    None       Julianne Rice, MD 01/28/19 2346    Maudie Flakes, MD 01/29/19 [phone]

## 2019-01-31 LAB — URINE CULTURE: Culture: >=100000 — AB

## 2019-02-01 ENCOUNTER — Telehealth: Payer: Self-pay

## 2019-02-01 NOTE — Telephone Encounter (Signed)
Post ED Visit - Positive Culture Follow-up  Culture report reviewed by antimicrobial stewardship pharmacist: [facility] Team []  Elenor Quinones, Pharm.D. [x]  Heide Guile, Pharm.D., BCPS AQ-ID []  Parks Neptune, Pharm.D., BCPS []  Alycia Rossetti, Pharm.D., BCPS []  [place], Pharm.D., BCPS, AAHIVP []  Legrand Como, Pharm.D., BCPS, AAHIVP []  Salome Arnt, PharmD, BCPS []  Johnnette Gourd, PharmD, BCPS []  Hughes Better, PharmD, BCPS []  Leeroy Cha, PharmD []  Laqueta Linden, PharmD, BCPS []  Albertina Parr, PharmD  [place] Team []  Leodis Sias, PharmD []  Lindell Spar, PharmD []  Royetta Asal, PharmD []  Graylin Shiver, Rph []  Rema Fendt) Glennon Mac, PharmD []  Arlyn Dunning, PharmD []  Netta Cedars, PharmD []  Dia Sitter, PharmD []  Leone Haven, PharmD []  Gretta Arab, PharmD []  Theodis Shove, PharmD []  Peggyann Juba, PharmD []  Reuel Boom, PharmD   Positive urine culture and no further patient follow-up is required at this time.  Genia Del 02/01/2019, 10:16 AM

## 2019-05-17 DIAGNOSIS — E7849 Other hyperlipidemia: Secondary | ICD-10-CM | POA: Diagnosis not present

## 2019-05-24 DIAGNOSIS — Z1331 Encounter for screening for depression: Secondary | ICD-10-CM | POA: Diagnosis not present

## 2019-05-24 DIAGNOSIS — H9190 Unspecified hearing loss, unspecified ear: Secondary | ICD-10-CM | POA: Diagnosis not present

## 2019-05-24 DIAGNOSIS — E669 Obesity, unspecified: Secondary | ICD-10-CM | POA: Diagnosis not present

## 2019-05-24 DIAGNOSIS — G309 Alzheimer's disease, unspecified: Secondary | ICD-10-CM | POA: Diagnosis not present

## 2019-05-24 DIAGNOSIS — Z1339 Encounter for screening examination for other mental health and behavioral disorders: Secondary | ICD-10-CM | POA: Diagnosis not present

## 2019-05-24 DIAGNOSIS — G4733 Obstructive sleep apnea (adult) (pediatric): Secondary | ICD-10-CM | POA: Diagnosis not present

## 2019-05-24 DIAGNOSIS — I10 Essential (primary) hypertension: Secondary | ICD-10-CM | POA: Diagnosis not present

## 2019-05-24 DIAGNOSIS — Z0000 Encounter for general adult medical examination without abnormal findings: Secondary | ICD-10-CM | POA: Diagnosis not present

## 2019-05-24 DIAGNOSIS — E785 Hyperlipidemia, unspecified: Secondary | ICD-10-CM | POA: Diagnosis not present

## 2019-05-24 DIAGNOSIS — M545 Low back pain: Secondary | ICD-10-CM | POA: Diagnosis not present

## 2019-05-24 DIAGNOSIS — J45909 Unspecified asthma, uncomplicated: Secondary | ICD-10-CM | POA: Diagnosis not present

## 2019-05-24 DIAGNOSIS — R269 Unspecified abnormalities of gait and mobility: Secondary | ICD-10-CM | POA: Diagnosis not present

## 2020-04-01 IMAGING — CT CT HEAD W/O CM
3 series · 16 of 47 positions shown, 19 images · non-contrast
Comparison: None.

CLINICAL DATA: Altered level of consciousness. Weakness, near
syncope.

EXAM:
CT HEAD WITHOUT CONTRAST
TECHNIQUE: Contiguous axial images were obtained from the base of the skull
through the vertex without intravenous contrast.

[Series 3: head 5.0 h30s · axial · 0.42mm/px · z∈[+1148,+1288]mm · 10 of 34 slices shown, 13 images]
[im 3/34  brain]
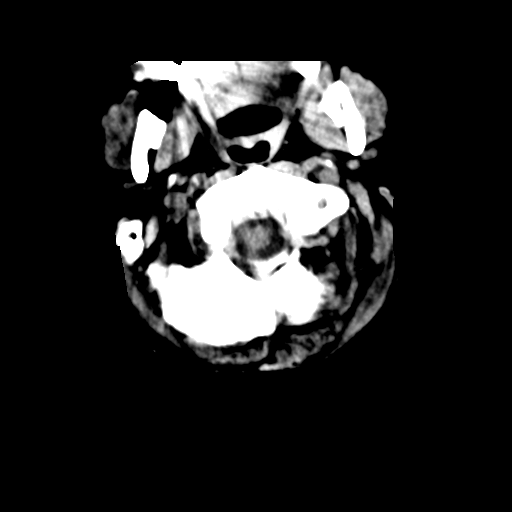
[im 3/34  bone]
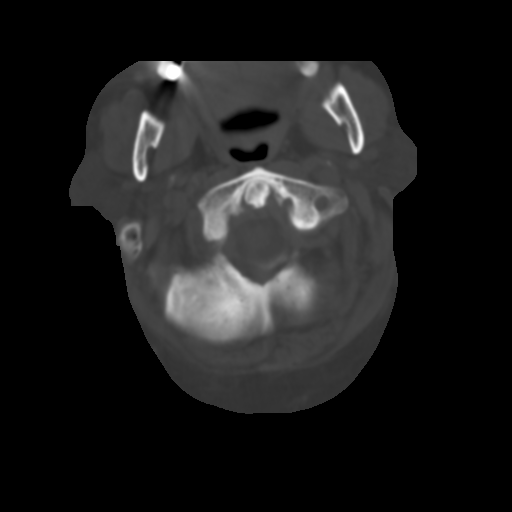
[im 6/34  brain]
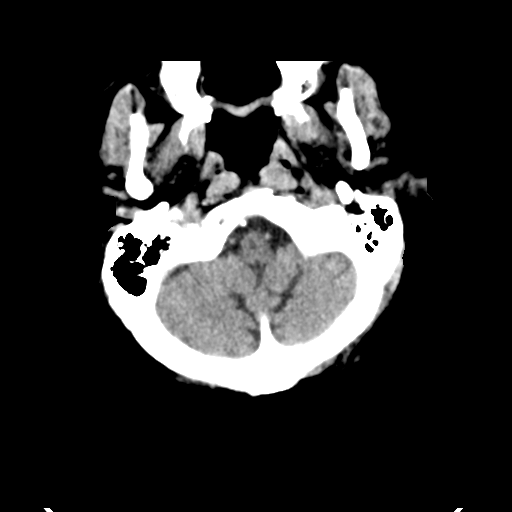
[im 10/34  brain]
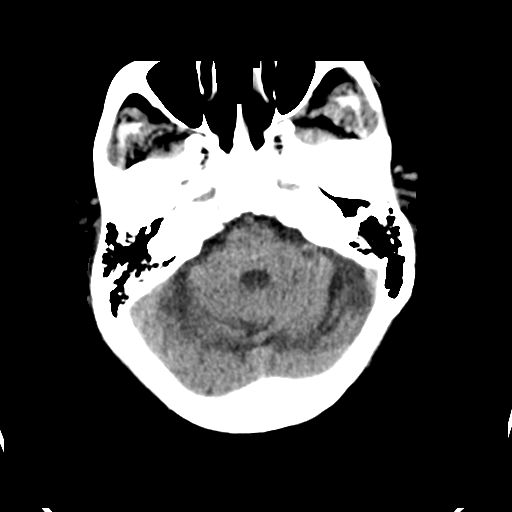
[im 12/34  brain]
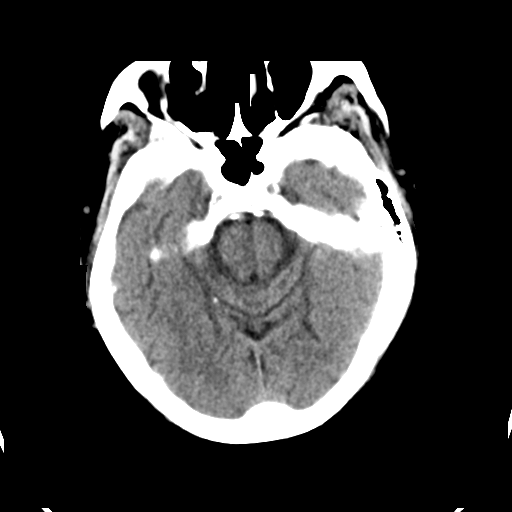
[im 15/34  brain]
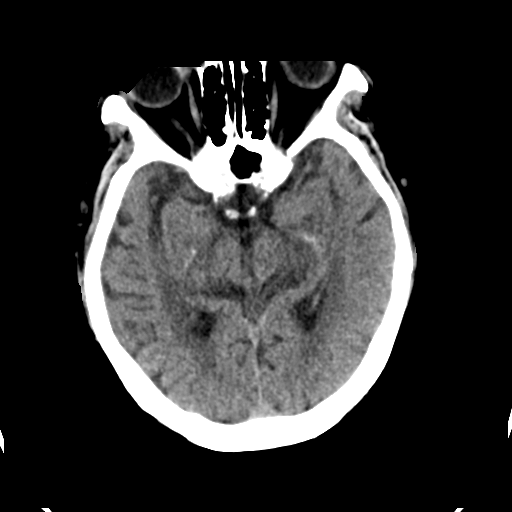
[im 15/34  bone]
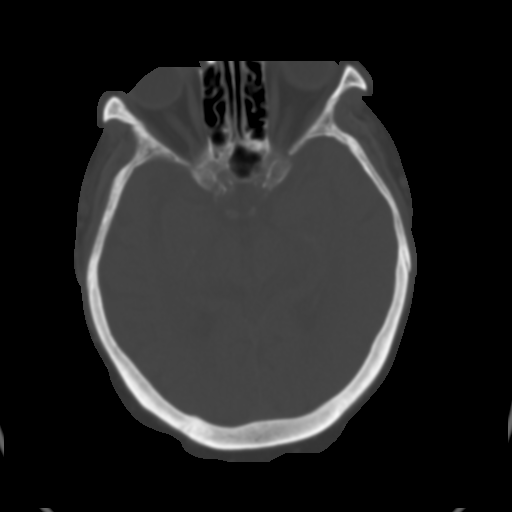
[im 19/34  brain]
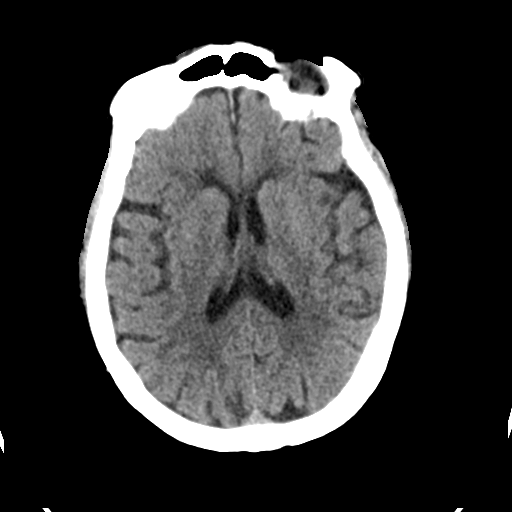
[im 22/34  brain]
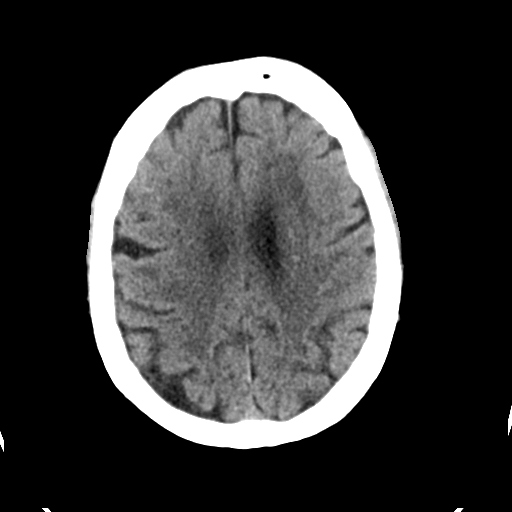
[im 26/34  brain]
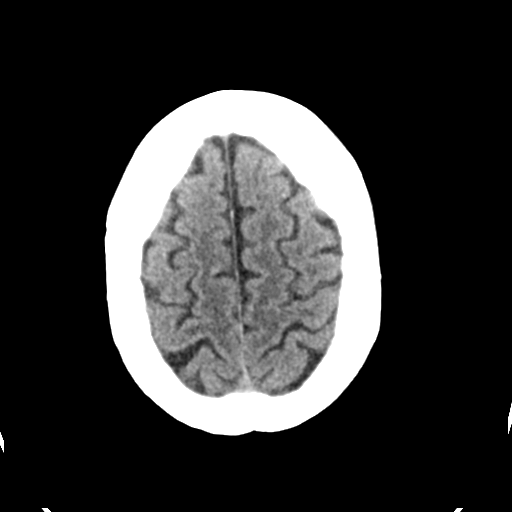
[im 28/34  brain]
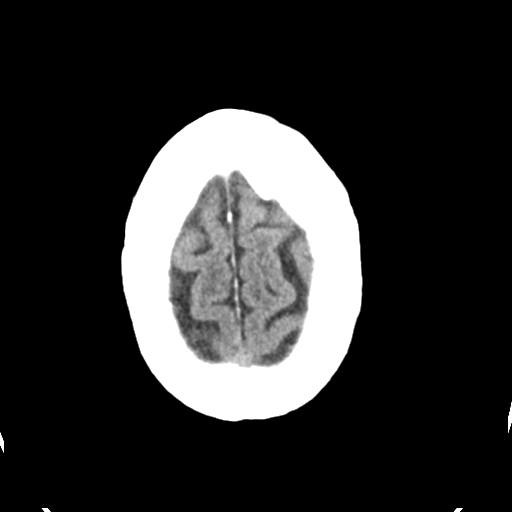
[im 28/34  bone]
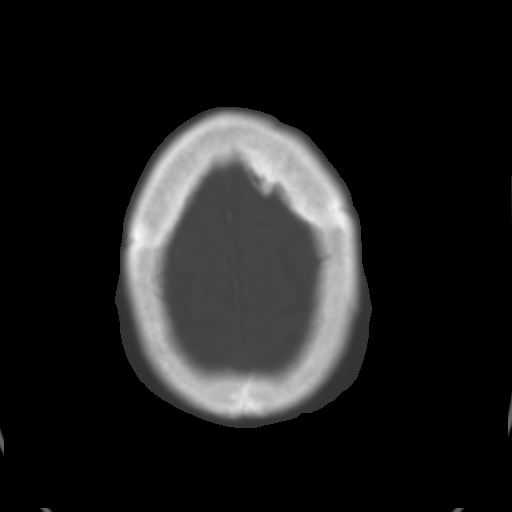
[im 31/34  brain]
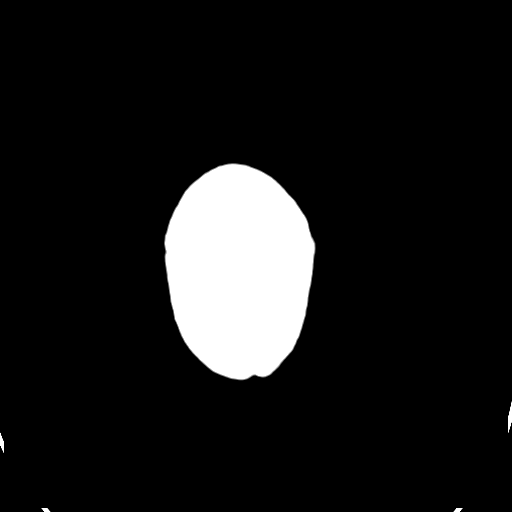

[Series 5: head 3.0 mpr cor · coronal · 0.33mm/px · 3 of 70 slices shown]
[im 24/70  brain]
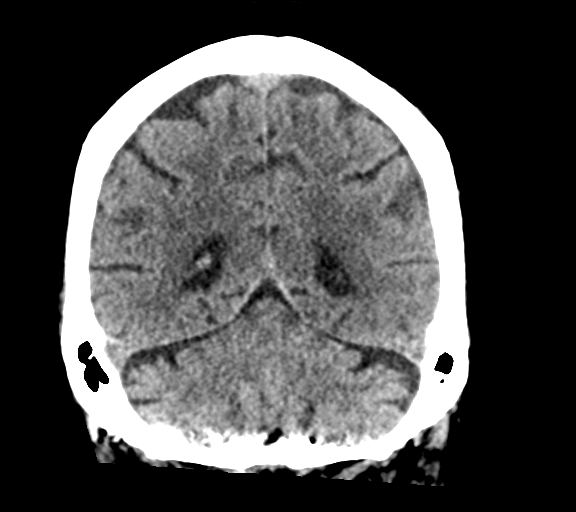
[im 31/70  brain]
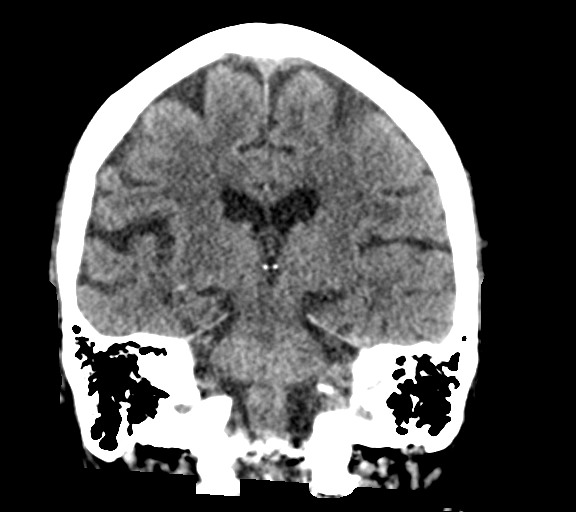
[im 39/70  brain]
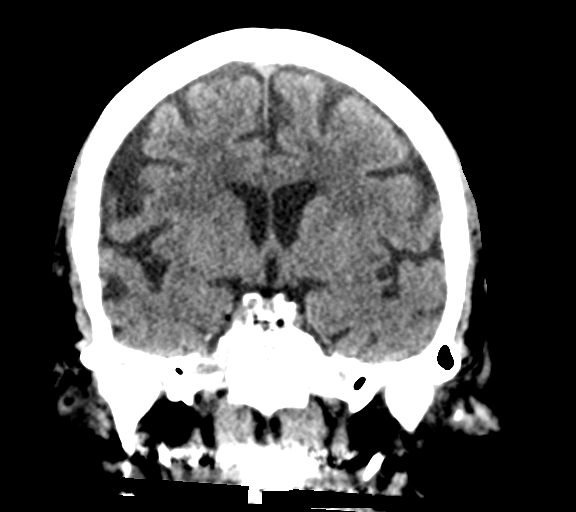

[Series 6: head 3.0 mpr sag · sagittal · 0.34mm/px · 3 of 56 slices shown]
[im 19/56  brain]
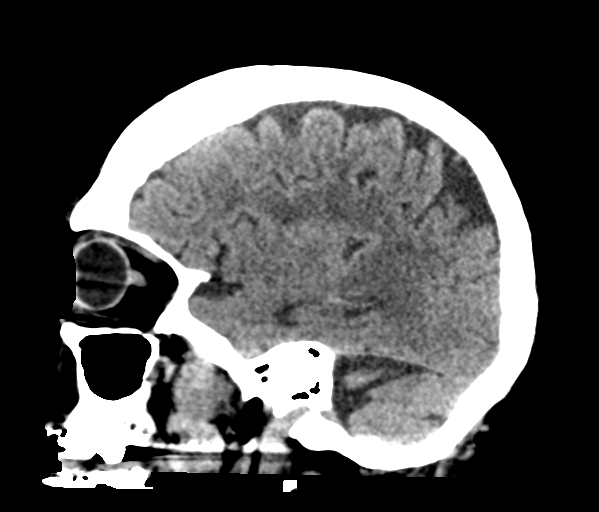
[im 28/56  brain]
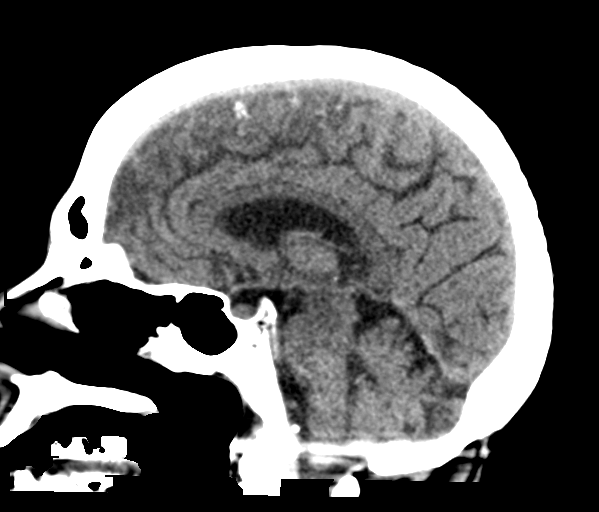
[im 37/56  brain]
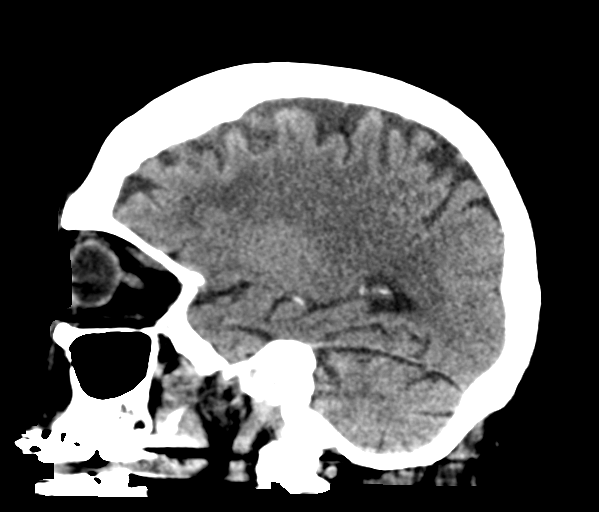

[16 of 47 positions shown; findings below may reference images not displayed]

FINDINGS: Brain: Patchy chronic small vessel disease throughout the deep white
matter. No acute intracranial abnormality. Specifically, no
hemorrhage, hydrocephalus, mass lesion, acute infarction, or
significant intracranial injury.

Vascular: No hyperdense vessel or unexpected calcification.

Skull: No acute calvarial abnormality.

Sinuses/Orbits: Visualized paranasal sinuses and mastoids clear.
Orbital soft tissues unremarkable.

Other: None
IMPRESSION: Patchy chronic small vessel disease. No acute intracranial
abnormality.

## 2020-05-26 DIAGNOSIS — E785 Hyperlipidemia, unspecified: Secondary | ICD-10-CM | POA: Diagnosis not present

## 2020-06-02 DIAGNOSIS — R269 Unspecified abnormalities of gait and mobility: Secondary | ICD-10-CM | POA: Diagnosis not present

## 2020-06-02 DIAGNOSIS — R7401 Elevation of levels of liver transaminase levels: Secondary | ICD-10-CM | POA: Diagnosis not present

## 2020-06-02 DIAGNOSIS — E669 Obesity, unspecified: Secondary | ICD-10-CM | POA: Diagnosis not present

## 2020-06-02 DIAGNOSIS — J45909 Unspecified asthma, uncomplicated: Secondary | ICD-10-CM | POA: Diagnosis not present

## 2020-06-02 DIAGNOSIS — R82998 Other abnormal findings in urine: Secondary | ICD-10-CM | POA: Diagnosis not present

## 2020-06-02 DIAGNOSIS — M179 Osteoarthritis of knee, unspecified: Secondary | ICD-10-CM | POA: Diagnosis not present

## 2020-06-02 DIAGNOSIS — G4733 Obstructive sleep apnea (adult) (pediatric): Secondary | ICD-10-CM | POA: Diagnosis not present

## 2020-06-02 DIAGNOSIS — E785 Hyperlipidemia, unspecified: Secondary | ICD-10-CM | POA: Diagnosis not present

## 2020-06-02 DIAGNOSIS — Z0000 Encounter for general adult medical examination without abnormal findings: Secondary | ICD-10-CM | POA: Diagnosis not present

## 2020-06-02 DIAGNOSIS — G309 Alzheimer's disease, unspecified: Secondary | ICD-10-CM | POA: Diagnosis not present

## 2020-06-02 DIAGNOSIS — I10 Essential (primary) hypertension: Secondary | ICD-10-CM | POA: Diagnosis not present

## 2020-06-02 DIAGNOSIS — Z1331 Encounter for screening for depression: Secondary | ICD-10-CM | POA: Diagnosis not present

## 2020-06-16 DIAGNOSIS — N281 Cyst of kidney, acquired: Secondary | ICD-10-CM | POA: Diagnosis not present

## 2020-06-16 DIAGNOSIS — R748 Abnormal levels of other serum enzymes: Secondary | ICD-10-CM | POA: Diagnosis not present

## 2020-06-16 DIAGNOSIS — R188 Other ascites: Secondary | ICD-10-CM | POA: Diagnosis not present

## 2020-06-26 DIAGNOSIS — K5730 Diverticulosis of large intestine without perforation or abscess without bleeding: Secondary | ICD-10-CM | POA: Diagnosis not present

## 2020-06-26 DIAGNOSIS — R634 Abnormal weight loss: Secondary | ICD-10-CM | POA: Diagnosis not present

## 2020-06-26 DIAGNOSIS — R188 Other ascites: Secondary | ICD-10-CM | POA: Diagnosis not present

## 2020-06-26 DIAGNOSIS — D1771 Benign lipomatous neoplasm of kidney: Secondary | ICD-10-CM | POA: Diagnosis not present

## 2020-06-26 DIAGNOSIS — N281 Cyst of kidney, acquired: Secondary | ICD-10-CM | POA: Diagnosis not present

## 2020-07-01 DIAGNOSIS — J90 Pleural effusion, not elsewhere classified: Secondary | ICD-10-CM | POA: Diagnosis not present

## 2020-07-01 DIAGNOSIS — N049 Nephrotic syndrome with unspecified morphologic changes: Secondary | ICD-10-CM | POA: Diagnosis not present

## 2020-07-01 DIAGNOSIS — G4733 Obstructive sleep apnea (adult) (pediatric): Secondary | ICD-10-CM | POA: Diagnosis not present

## 2020-07-01 DIAGNOSIS — N184 Chronic kidney disease, stage 4 (severe): Secondary | ICD-10-CM | POA: Diagnosis not present

## 2020-07-01 DIAGNOSIS — R0600 Dyspnea, unspecified: Secondary | ICD-10-CM | POA: Diagnosis not present

## 2020-07-01 DIAGNOSIS — I5033 Acute on chronic diastolic (congestive) heart failure: Secondary | ICD-10-CM | POA: Diagnosis not present

## 2020-07-01 DIAGNOSIS — I110 Hypertensive heart disease with heart failure: Secondary | ICD-10-CM | POA: Diagnosis not present

## 2020-07-01 DIAGNOSIS — E669 Obesity, unspecified: Secondary | ICD-10-CM | POA: Diagnosis not present

## 2020-07-01 DIAGNOSIS — E785 Hyperlipidemia, unspecified: Secondary | ICD-10-CM | POA: Diagnosis not present

## 2020-07-01 DIAGNOSIS — M179 Osteoarthritis of knee, unspecified: Secondary | ICD-10-CM | POA: Diagnosis not present

## 2020-07-02 ENCOUNTER — Other Ambulatory Visit (HOSPITAL_COMMUNITY): Payer: Self-pay | Admitting: Internal Medicine

## 2020-07-02 ENCOUNTER — Other Ambulatory Visit: Payer: Self-pay | Admitting: Internal Medicine

## 2020-07-02 DIAGNOSIS — J90 Pleural effusion, not elsewhere classified: Secondary | ICD-10-CM

## 2020-07-07 ENCOUNTER — Ambulatory Visit (HOSPITAL_COMMUNITY)
Admission: RE | Admit: 2020-07-07 | Discharge: 2020-07-07 | Disposition: A | Discharge status: Home / Self Care | Payer: Medicare Other | Source: Ambulatory Visit | Attending: Internal Medicine | Admitting: Internal Medicine

## 2020-07-07 ENCOUNTER — Other Ambulatory Visit (HOSPITAL_COMMUNITY): Payer: Self-pay | Admitting: Internal Medicine

## 2020-07-07 ENCOUNTER — Other Ambulatory Visit: Payer: Self-pay

## 2020-07-07 DIAGNOSIS — J90 Pleural effusion, not elsewhere classified: Secondary | ICD-10-CM | POA: Insufficient documentation

## 2020-07-07 DIAGNOSIS — J948 Other specified pleural conditions: Secondary | ICD-10-CM | POA: Diagnosis not present

## 2020-07-07 DIAGNOSIS — J984 Other disorders of lung: Secondary | ICD-10-CM | POA: Diagnosis not present

## 2020-07-07 HISTORY — PX: IR THORACENTESIS ASP PLEURAL SPACE W/IMG GUIDE: IMG5380

## 2020-07-07 LAB — PROTEIN, PLEURAL OR PERITONEAL FLUID: Total protein, fluid: <3 g/dL

## 2020-07-07 LAB — GRAM STAIN

## 2020-07-07 LAB — BODY FLUID CELL COUNT WITH DIFFERENTIAL
Eos, Fluid: 0 %
Lymphs, Fluid: 58 %
Monocyte-Macrophage-Serous Fluid: 35 % — ABNORMAL LOW (ref 50–90)
Neutrophil Count, Fluid: 7 % (ref 0–25)
Total Nucleated Cell Count, Fluid: 139 cu mm (ref 0–1000)

## 2020-07-07 MED ORDER — LIDOCAINE HCL 1 % IJ SOLN
INTRAMUSCULAR | Status: AC
  Filled 2020-07-07: qty 20

## 2020-07-07 MED ORDER — LIDOCAINE HCL (PF) 1 % IJ SOLN
INTRAMUSCULAR | Status: DC | PRN
  Administered 2020-07-07: 10 mL

## 2020-07-07 NOTE — Procedures (Signed)
PROCEDURE SUMMARY:  Successful US guided left thoracentesis. Yielded 1 L of light, clear yellow fluid. Pt tolerated procedure well. No immediate complications.  Specimen sent for labs. CXR ordered; no post-procedure pneumothorax identified  EBL < 2 mL  Theresa Duty, NP 07/07/2020 11:16 AM

## 2020-07-08 DIAGNOSIS — R609 Edema, unspecified: Secondary | ICD-10-CM | POA: Diagnosis not present

## 2020-07-08 DIAGNOSIS — I129 Hypertensive chronic kidney disease with stage 1 through stage 4 chronic kidney disease, or unspecified chronic kidney disease: Secondary | ICD-10-CM | POA: Diagnosis not present

## 2020-07-08 DIAGNOSIS — R3129 Other microscopic hematuria: Secondary | ICD-10-CM | POA: Diagnosis not present

## 2020-07-08 DIAGNOSIS — N185 Chronic kidney disease, stage 5: Secondary | ICD-10-CM | POA: Diagnosis not present

## 2020-07-08 DIAGNOSIS — J90 Pleural effusion, not elsewhere classified: Secondary | ICD-10-CM | POA: Diagnosis not present

## 2020-07-08 DIAGNOSIS — R7989 Other specified abnormal findings of blood chemistry: Secondary | ICD-10-CM | POA: Diagnosis not present

## 2020-07-08 DIAGNOSIS — E785 Hyperlipidemia, unspecified: Secondary | ICD-10-CM | POA: Diagnosis not present

## 2020-07-08 DIAGNOSIS — N049 Nephrotic syndrome with unspecified morphologic changes: Secondary | ICD-10-CM | POA: Diagnosis not present

## 2020-07-08 DIAGNOSIS — N179 Acute kidney failure, unspecified: Secondary | ICD-10-CM | POA: Diagnosis not present

## 2020-07-08 LAB — ACID FAST SMEAR (AFB, MYCOBACTERIA): Acid Fast Smear: NEGATIVE

## 2020-07-08 LAB — CYTOLOGY - NON PAP

## 2020-07-11 ENCOUNTER — Other Ambulatory Visit (HOSPITAL_COMMUNITY): Payer: Self-pay | Admitting: Nephrology

## 2020-07-11 DIAGNOSIS — I129 Hypertensive chronic kidney disease with stage 1 through stage 4 chronic kidney disease, or unspecified chronic kidney disease: Secondary | ICD-10-CM | POA: Diagnosis not present

## 2020-07-11 DIAGNOSIS — N184 Chronic kidney disease, stage 4 (severe): Secondary | ICD-10-CM | POA: Diagnosis not present

## 2020-07-11 DIAGNOSIS — E039 Hypothyroidism, unspecified: Secondary | ICD-10-CM | POA: Diagnosis not present

## 2020-07-11 DIAGNOSIS — J90 Pleural effusion, not elsewhere classified: Secondary | ICD-10-CM | POA: Diagnosis not present

## 2020-07-11 DIAGNOSIS — N179 Acute kidney failure, unspecified: Secondary | ICD-10-CM

## 2020-07-11 DIAGNOSIS — N049 Nephrotic syndrome with unspecified morphologic changes: Secondary | ICD-10-CM

## 2020-07-12 LAB — CULTURE, BODY FLUID W GRAM STAIN -BOTTLE: Culture: NO GROWTH

## 2020-07-14 ENCOUNTER — Other Ambulatory Visit: Payer: Self-pay | Admitting: Radiology

## 2020-07-15 ENCOUNTER — Ambulatory Visit (HOSPITAL_COMMUNITY)
Admission: RE | Admit: 2020-07-15 | Discharge: 2020-07-15 | Disposition: A | Discharge status: Home / Self Care | Payer: Medicare Other | Source: Ambulatory Visit | Attending: Nephrology | Admitting: Nephrology

## 2020-07-15 ENCOUNTER — Encounter (HOSPITAL_COMMUNITY): Payer: Self-pay

## 2020-07-15 DIAGNOSIS — Z79899 Other long term (current) drug therapy: Secondary | ICD-10-CM | POA: Insufficient documentation

## 2020-07-15 DIAGNOSIS — Z79890 Hormone replacement therapy (postmenopausal): Secondary | ICD-10-CM | POA: Insufficient documentation

## 2020-07-15 DIAGNOSIS — N049 Nephrotic syndrome with unspecified morphologic changes: Secondary | ICD-10-CM | POA: Diagnosis not present

## 2020-07-15 DIAGNOSIS — N269 Renal sclerosis, unspecified: Secondary | ICD-10-CM | POA: Diagnosis not present

## 2020-07-15 DIAGNOSIS — I120 Hypertensive chronic kidney disease with stage 5 chronic kidney disease or end stage renal disease: Secondary | ICD-10-CM | POA: Insufficient documentation

## 2020-07-15 DIAGNOSIS — N179 Acute kidney failure, unspecified: Secondary | ICD-10-CM

## 2020-07-15 DIAGNOSIS — N170 Acute kidney failure with tubular necrosis: Secondary | ICD-10-CM | POA: Insufficient documentation

## 2020-07-15 DIAGNOSIS — N189 Chronic kidney disease, unspecified: Secondary | ICD-10-CM | POA: Insufficient documentation

## 2020-07-15 LAB — CBC
HCT: 44.6 % (ref 36.0–46.0)
Hemoglobin: 14.4 g/dL (ref 12.0–15.0)
MCH: 30.9 pg (ref 26.0–34.0)
MCHC: 32.3 g/dL (ref 30.0–36.0)
MCV: 95.7 fL (ref 80.0–100.0)
Platelets: 356 10*3/uL (ref 150–400)
RBC: 4.66 MIL/uL (ref 3.87–5.11)
RDW: 14.2 % (ref 11.5–15.5)
WBC: 9.1 10*3/uL (ref 4.0–10.5)
nRBC: 0 % (ref 0.0–0.2)

## 2020-07-15 LAB — PROTIME-INR
INR: 0.9 (ref 0.8–1.2)
Prothrombin Time: 12.2 seconds (ref 11.4–15.2)

## 2020-07-15 MED ORDER — FENTANYL CITRATE (PF) 100 MCG/2ML IJ SOLN
INTRAMUSCULAR | Status: AC
  Filled 2020-07-15: qty 2

## 2020-07-15 MED ORDER — MIDAZOLAM HCL 2 MG/2ML IJ SOLN
INTRAMUSCULAR | Status: AC
  Filled 2020-07-15: qty 2

## 2020-07-15 MED ORDER — SODIUM CHLORIDE 0.9 % IV SOLN: INTRAVENOUS | Status: DC

## 2020-07-15 MED ORDER — FENTANYL CITRATE (PF) 100 MCG/2ML IJ SOLN
INTRAMUSCULAR | Status: AC | PRN
  Administered 2020-07-15 (×2): 25 ug via INTRAVENOUS

## 2020-07-15 MED ORDER — MIDAZOLAM HCL 2 MG/2ML IJ SOLN
INTRAMUSCULAR | Status: AC | PRN
  Administered 2020-07-15 (×2): 1 mg via INTRAVENOUS

## 2020-07-15 MED ORDER — GELATIN ABSORBABLE 12-7 MM EX MISC
CUTANEOUS | Status: AC
  Filled 2020-07-15: qty 1

## 2020-07-15 MED ORDER — LIDOCAINE HCL (PF) 1 % IJ SOLN
INTRAMUSCULAR | Status: AC
  Filled 2020-07-15: qty 30

## 2020-07-15 MED ORDER — LIDOCAINE HCL (PF) 1 % IJ SOLN
INTRAMUSCULAR | Status: AC | PRN
  Administered 2020-07-15: 5 mL

## 2020-07-15 NOTE — Procedures (Signed)
Interventional Radiology Procedure Note  Procedure: Random renal biopsy, LEFT   Complications: None  Estimated Blood Loss: None  Recommendations: - Bedrest x 4 hrs - Path sent   Signed,  Criselda Peaches, MD

## 2020-07-15 NOTE — Progress Notes (Signed)
Patient ambulated w/ assist to BR, voided. Urine yellow in color. Patient has expiratory wheezing and daughter stated this was not new. O2 sats WNL.

## 2020-07-15 NOTE — Discharge Instructions (Addendum)
Percutaneous Kidney Biopsy, Care After This sheet gives you information about how to care for yourself after your procedure. Your health care provider may also give you more specific instructions. If you have problems or questions, contact your health care provider. What can I expect after the procedure? After the procedure, it is common to have:  Pain or soreness near the biopsy site.  Pink or cloudy urine for 24 hours after the procedure. This is normal. Follow these instructions at home: Activity  Return to your normal activities as told by your health care provider. Ask your health care provider what activities are safe for you.  If you were given a sedative during the procedure, it can affect you for several hours. Do not drive or operate machinery until your health care provider says that it is safe.  Do not lift anything that is heavier than 10 lb (4.5 kg), or the limit that you are told, until your health care provider says that it is safe.  Avoid activities that take a lot of effort until your health care provider approves. Most people will have to wait 2 weeks before returning to activities such as exercise or sex. General instructions  Take over-the-counter and prescription medicines only as told by your health care provider.  Follow instructions from your health care provider about eating or drinking restrictions.  Check your biopsy site every day for signs of infection. Check for: ? More redness, swelling, or pain. ? Fluid or blood. ? Warmth. ? Pus or a bad smell.  Keep all follow-up visits as told by your health care provider. This is important.   Contact a health care provider if:  You have more redness, swelling, or pain around your biopsy site.  You have fluid or blood coming from your biopsy site.  Your biopsy site feels warm to the touch.  You have pus or a bad smell coming from your biopsy site.  You have blood in your urine more than 24 hours after your  procedure. Get help right away if:  Your urine is dark red or brown.  You have a fever.  You are not able to urinate.  You feel burning when you urinate.  You feel dizzy or light-headed.  You have severe pain in your abdomen or side. Summary  After the procedure, it is common to have pain or soreness at the biopsy site and pink or cloudy urine for the first 24 hours.  Check your biopsy site each day for signs of infection, such as more redness, swelling, or pain; fluid, blood, pus or a bad smell coming from the biopsy site; or the biopsy site feeling warm to the touch.  Return to your normal activities as told by your health care provider. This information is not intended to replace advice given to you by your health care provider. Make sure you discuss any questions you have with your health care provider. Document Revised: 05/25/2019 Document Reviewed: 05/25/2019 Elsevier Patient Education  2021 Elsevier Inc. Moderate Conscious Sedation, Adult Sedation is the use of medicines to promote relaxation and to relieve discomfort and anxiety. Moderate conscious sedation is a type of sedation. Under moderate conscious sedation, you are less alert than normal, but you are still able to respond to instructions, touch, or both. Moderate conscious sedation is used during short medical and dental procedures. It is milder than deep sedation, which is a type of sedation under which you cannot be easily woken up. It is also milder than   general anesthesia, which is the use of medicines to make you unconscious. Moderate conscious sedation allows you to return to your regular activities sooner. Tell a health care provider about:  Any allergies you have.  All medicines you are taking, including vitamins, herbs, eye drops, creams, and over-the-counter medicines.  Any use of steroids. This includes steroids taken by mouth or as a cream.  Any problems you or family members have had with sedatives and  anesthetic medicines.  Any blood disorders you have.  Any surgeries you have had.  Any medical conditions you have, such as sleep apnea.  Whether you are pregnant or may be pregnant.  Any use of cigarettes, alcohol, marijuana, or drugs. What are the risks? Generally, this is a safe procedure. However, problems may occur, including:  Getting too much medicine (oversedation).  Nausea.  Allergic reaction to medicines.  Trouble breathing. If this happens, a breathing tube may be used. It will be removed when you are awake and breathing on your own.  Heart trouble.  Lung trouble.  Confusion that gets better with time (emergence delirium). What happens before the procedure? Staying hydrated Follow instructions from your health care provider about hydration, which may include:  Up to 2 hours before the procedure - you may continue to drink clear liquids, such as water, clear fruit juice, black coffee, and plain tea. Eating and drinking restrictions Follow instructions from your health care provider about eating and drinking, which may include:  8 hours before the procedure - stop eating heavy meals or foods, such as meat, fried foods, or fatty foods.  6 hours before the procedure - stop eating light meals or foods, such as toast or cereal.  6 hours before the procedure - stop drinking milk or drinks that contain milk.  2 hours before the procedure - stop drinking clear liquids. Medicines Ask your health care provider about:  Changing or stopping your regular medicines. This is especially important if you are taking diabetes medicines or blood thinners.  Taking medicines such as aspirin and ibuprofen. These medicines can thin your blood. Do not take these medicines unless your health care provider tells you to take them.  Taking over-the-counter medicines, vitamins, herbs, and supplements. Tests and exams  You will have a physical exam.  You may have blood tests done to  show how well: ? Your kidneys and liver work. ? Your blood clots. General instructions  Plan to have a responsible adult take you home from the hospital or clinic.  If you will be going home right after the procedure, plan to have a responsible adult care for you for the time you are told. This is important. What happens during the procedure?  You will be given the sedative. The sedative may be given: ? As a pill that you will swallow. It can also be inserted into the rectum. ? As a spray through the nose. ? As an injection into the muscle. ? As an injection into the vein through an IV.  You may be given oxygen as needed.  Your breathing, heart rate, and blood pressure will be monitored during the procedure.  The medical or dental procedure will be done. The procedure may vary among health care providers and hospitals.   What happens after the procedure?  Your blood pressure, heart rate, breathing rate, and blood oxygen level will be monitored until you leave the hospital or clinic.  You will get fluids through your IV if needed.  Do not drive   or operate machinery until your health care provider says that it is safe. Summary  Sedation is the use of medicines to promote relaxation and to relieve discomfort and anxiety. Moderate conscious sedation is a type of sedation that is used during short medical and dental procedures.  Tell the health care provider about any medical conditions that you have and about all the medicines that you are taking.  You will be given the sedative as a pill, a spray through the nose, an injection into the muscle, or an injection into the vein through an IV. Vital signs are monitored during the sedation.  Moderate conscious sedation allows you to return to your regular activities sooner. This information is not intended to replace advice given to you by your health care provider. Make sure you discuss any questions you have with your health care  provider. Document Revised: 06/29/2019 Document Reviewed: 01/25/2019 Elsevier Patient Education  2021 Elsevier Inc.  

## 2020-07-15 NOTE — H&P (Signed)
Chief Complaint: Patient was seen in consultation today for random renal biopsy at the request of Singh,Vikas  Referring Physician(s): Singh,Vikas  Supervising Physician: Jacqulynn Cadet  Patient Status: [facility] - Out-pt  History of Present Illness: Jacqueline Li is a 87 y.o. female   Pt referred for bx per Dr Loyal Gambler Symptoms of SOB; diffuse edema; fatigue--- al developed in just last 3 weeks per Dtr L Thora : 1 L yellow fluid in IR 07/07/20 FINAL MICROSCOPIC DIAGNOSIS:  - Reactive mesothelial cells present  - No malignant cells identified   Edema has decreased minimally ++ proteinuria + hematuria Increasing Cr Nephrotic syndrome per Nephrology  Scheduled for random renal biopsy  Past Medical History:  Diagnosis Date  . Hyperlipidemia   . Hypertension     Past Surgical History:  Procedure Laterality Date  . IR THORACENTESIS ASP PLEURAL SPACE W/IMG GUIDE  07/07/2020  . KNEE SURGERY      Allergies: Patient has no known allergies.  Medications: Prior to Admission medications   Medication Sig Start Date End Date Taking? Authorizing Provider  b complex vitamins capsule Take 1 capsule by mouth daily.   Yes [provider]  furosemide (LASIX) 20 MG tablet Take 20 mg by mouth daily.   Yes [provider]  levothyroxine (SYNTHROID) 25 MCG tablet Take 25 mcg by mouth daily before breakfast.   Yes [provider]  metoprolol succinate (TOPROL-XL) 50 MG 24 hr tablet Take 100 mg by mouth daily. 11/17/18  Yes [provider]  Multiple Vitamins-Minerals (MULTIVITAMIN ADULT) CHEW Chew 2 each by mouth daily.   Yes [provider]     History reviewed. No pertinent family history.  Social History   Socioeconomic History  . Marital status: Widowed    Spouse name: Not on file  . Number of children: Not on file  . Years of education: Not on file  . Highest education level: Not on file  Occupational History  . Not on file   Tobacco Use  . Smoking status: Never Smoker  . Smokeless tobacco: Never Used  Substance and Sexual Activity  . Alcohol use: Not on file  . Drug use: Not on file  . Sexual activity: Not on file  Other Topics Concern  . Not on file  Social History Narrative  . Not on file   Social Determinants of Health   Financial Resource Strain: Not on file  Food Insecurity: Not on file  Transportation Needs: Not on file  Physical Activity: Not on file  Stress: Not on file  Social Connections: Not on file    Review of Systems: A 12 point ROS discussed and pertinent positives are indicated in the HPI above.  All other systems are negative.  Review of Systems  Constitutional: Positive for activity change and fatigue.  HENT: Positive for hearing loss.   Respiratory: Positive for shortness of breath. Negative for cough.   Cardiovascular: Negative for chest pain.  Gastrointestinal: Negative for abdominal pain and nausea.  Neurological: Positive for weakness.  Psychiatric/Behavioral: Positive for confusion. Negative for behavioral problems.    Vital Signs: BP (!) (P) 186/68   Pulse (P) 62   Temp (P) 97.6 F (36.4 C) (Oral)   Resp (P) 20   Ht (P) 5\' 8"  (1.727 m)   Wt (P) 195 lb (88.5 kg)   SpO2 (P) 98%   BMI (P) 29.65 kg/m   Physical Exam Vitals reviewed.  HENT:     Mouth/Throat:  Mouth: Mucous membranes are moist.  Cardiovascular:     Rate and Rhythm: Normal rate and regular rhythm.     Heart sounds: Normal heart sounds.  Pulmonary:     Breath sounds: Wheezing present.  Abdominal:     Palpations: Abdomen is soft.     Tenderness: There is no abdominal tenderness.  Musculoskeletal:        General: Swelling present. Normal range of motion.     Right lower leg: Edema present.     Left lower leg: Edema present.  Skin:    General: Skin is warm.  Neurological:     Mental Status: She is alert.     Comments: Mildly confused  Psychiatric:        Behavior: Behavior normal.      Imaging: DG Chest 1 View  Result Date: 07/07/2020 CLINICAL DATA:  Post thoracentesis.  1 L removed from the LEFT chest EXAM: CHEST  1 VIEW COMPARISON:  No recent comparison aside from imaging from thoracentesis procedure. Previous chest x-ray from 2004. FINDINGS: Trachea midline. Cardiomediastinal contours and hilar structures normal accounting for low lung volumes but partially obscured at the lung bases due to basilar airspace disease. Blunting of LEFT costodiaphragmatic sulcus. Increased density also at the medial RIGHT chest in the RIGHT retrocardiac region and partially obscuring the RIGHT hemidiaphragm. This area has a convex margin. No visible pneumothorax. IMPRESSION: 1. No pneumothorax after thoracentesis. 2. Area at the RIGHT lung base may represent a combination of airspace disease and or effusion but has convex margins just above the RIGHT hemidiaphragm. Consider follow-up PA and lateral chest radiograph to ensure resolution or chest CT for further assessment to ensure no mass in this location. Electronically Signed   By: Zetta Bills M.D.   On: 07/07/2020 11:20   IR THORACENTESIS ASP PLEURAL SPACE W/IMG GUIDE  Result Date: 07/07/2020 INDICATION: Patient presents today with new onset bilateral pleural effusions of unknown etiology. Interventional radiology asked to perform a therapeutic and diagnostic thoracentesis. EXAM: ULTRASOUND GUIDED THORACENTESIS MEDICATIONS: 1% lidocaine 10 mL COMPLICATIONS: None immediate. PROCEDURE: An ultrasound guided thoracentesis was thoroughly discussed with the patient and questions answered. The benefits, risks, alternatives and complications were also discussed. The patient understands and wishes to proceed with the procedure. Written consent was obtained. Ultrasound was performed to localize and mark an adequate pocket of fluid in the left chest. The area was then prepped and draped in the normal sterile fashion. 1% Lidocaine was used for local  anesthesia. Under ultrasound guidance a 6 Fr Safe-T-Centesis catheter was introduced. Thoracentesis was performed. The catheter was removed and a dressing applied. FINDINGS: A total of approximately 1 L of clear yellow fluid was removed. Samples were sent to the laboratory as requested by the clinical team. IMPRESSION: Successful ultrasound guided left thoracentesis yielding 1 L of pleural fluid. Read by: Soyla Dryer, NP Electronically Signed   By: Miachel Roux M.D.   On: 07/07/2020 11:19    Labs:  CBC: Recent Labs    07/15/20 0621  WBC 9.1  HGB 14.4  HCT 44.6  PLT 356    COAGS: Recent Labs    07/15/20 0621  INR 0.9    BMP: No results for input(s): NA, K, CL, CO2, GLUCOSE, BUN, CALCIUM, CREATININE, GFRNONAA, GFRAA in the last 8760 hours.  Invalid input(s): CMP  LIVER FUNCTION TESTS: No results for input(s): BILITOT, AST, ALT, ALKPHOS, PROT, ALBUMIN in the last 8760 hours.  TUMOR MARKERS: No results for input(s): AFPTM, CEA, CA199,  [place] in the last 8760 hours.  Assessment and Plan:  Nephrotic syndrome Acute renal injury Scheduled for random renal biopsy Risks and benefits of random renal biopsy was discussed with the patient and/or patient's family including, but not limited to bleeding, infection, damage to adjacent structures or low yield requiring additional tests.  All of the questions were answered and there is agreement to proceed.  Consent signed and in chart.   Thank you for this interesting consult.  I greatly enjoyed meeting Jacqueline Li and look forward to participating in their care.  A copy of this report was sent to the requesting provider on this date.  Electronically Signed: Lavonia Drafts, PA-C 07/15/2020, 7:30 AM   I spent a total of  30 Minutes   in face to face in clinical consultation, greater than 50% of which was counseling/coordinating care for random renal biopsy

## 2020-07-22 LAB — SURGICAL PATHOLOGY

## 2020-07-24 DIAGNOSIS — R3129 Other microscopic hematuria: Secondary | ICD-10-CM | POA: Diagnosis not present

## 2020-07-24 DIAGNOSIS — N049 Nephrotic syndrome with unspecified morphologic changes: Secondary | ICD-10-CM | POA: Diagnosis not present

## 2020-07-24 DIAGNOSIS — N051 Unspecified nephritic syndrome with focal and segmental glomerular lesions: Secondary | ICD-10-CM | POA: Diagnosis not present

## 2020-07-24 DIAGNOSIS — R609 Edema, unspecified: Secondary | ICD-10-CM | POA: Diagnosis not present

## 2020-07-24 DIAGNOSIS — N179 Acute kidney failure, unspecified: Secondary | ICD-10-CM | POA: Diagnosis not present

## 2020-07-24 DIAGNOSIS — I129 Hypertensive chronic kidney disease with stage 1 through stage 4 chronic kidney disease, or unspecified chronic kidney disease: Secondary | ICD-10-CM | POA: Diagnosis not present

## 2020-07-25 ENCOUNTER — Encounter (HOSPITAL_COMMUNITY): Payer: Self-pay

## 2020-08-01 DIAGNOSIS — N179 Acute kidney failure, unspecified: Secondary | ICD-10-CM | POA: Diagnosis not present

## 2020-08-04 DIAGNOSIS — J90 Pleural effusion, not elsewhere classified: Secondary | ICD-10-CM | POA: Diagnosis not present

## 2020-08-04 DIAGNOSIS — E440 Moderate protein-calorie malnutrition: Secondary | ICD-10-CM | POA: Diagnosis not present

## 2020-08-04 DIAGNOSIS — G4733 Obstructive sleep apnea (adult) (pediatric): Secondary | ICD-10-CM | POA: Diagnosis not present

## 2020-08-04 DIAGNOSIS — N049 Nephrotic syndrome with unspecified morphologic changes: Secondary | ICD-10-CM | POA: Diagnosis not present

## 2020-08-04 DIAGNOSIS — E039 Hypothyroidism, unspecified: Secondary | ICD-10-CM | POA: Diagnosis not present

## 2020-08-04 DIAGNOSIS — J45909 Unspecified asthma, uncomplicated: Secondary | ICD-10-CM | POA: Diagnosis not present

## 2020-08-04 DIAGNOSIS — N184 Chronic kidney disease, stage 4 (severe): Secondary | ICD-10-CM | POA: Diagnosis not present

## 2020-08-04 DIAGNOSIS — G309 Alzheimer's disease, unspecified: Secondary | ICD-10-CM | POA: Diagnosis not present

## 2020-08-08 ENCOUNTER — Emergency Department (HOSPITAL_COMMUNITY)
Admission: EM | Admit: 2020-08-08 | Discharge: 2020-08-09 | Disposition: A | Discharge status: Home / Self Care | Payer: Medicare Other | Attending: Emergency Medicine | Admitting: Emergency Medicine

## 2020-08-08 ENCOUNTER — Other Ambulatory Visit: Payer: Self-pay

## 2020-08-08 ENCOUNTER — Encounter (HOSPITAL_COMMUNITY): Payer: Self-pay | Admitting: Emergency Medicine

## 2020-08-08 DIAGNOSIS — R69 Illness, unspecified: Secondary | ICD-10-CM | POA: Diagnosis not present

## 2020-08-08 DIAGNOSIS — B372 Candidiasis of skin and nail: Secondary | ICD-10-CM | POA: Diagnosis not present

## 2020-08-08 DIAGNOSIS — N184 Chronic kidney disease, stage 4 (severe): Secondary | ICD-10-CM | POA: Diagnosis not present

## 2020-08-08 DIAGNOSIS — I130 Hypertensive heart and chronic kidney disease with heart failure and stage 1 through stage 4 chronic kidney disease, or unspecified chronic kidney disease: Secondary | ICD-10-CM | POA: Insufficient documentation

## 2020-08-08 DIAGNOSIS — J45909 Unspecified asthma, uncomplicated: Secondary | ICD-10-CM | POA: Insufficient documentation

## 2020-08-08 DIAGNOSIS — B379 Candidiasis, unspecified: Secondary | ICD-10-CM | POA: Diagnosis not present

## 2020-08-08 DIAGNOSIS — Z79899 Other long term (current) drug therapy: Secondary | ICD-10-CM | POA: Insufficient documentation

## 2020-08-08 DIAGNOSIS — I5031 Acute diastolic (congestive) heart failure: Secondary | ICD-10-CM | POA: Diagnosis not present

## 2020-08-08 DIAGNOSIS — B3789 Other sites of candidiasis: Secondary | ICD-10-CM

## 2020-08-08 DIAGNOSIS — R5381 Other malaise: Secondary | ICD-10-CM | POA: Diagnosis not present

## 2020-08-08 DIAGNOSIS — R21 Rash and other nonspecific skin eruption: Secondary | ICD-10-CM | POA: Diagnosis present

## 2020-08-08 DIAGNOSIS — E039 Hypothyroidism, unspecified: Secondary | ICD-10-CM | POA: Insufficient documentation

## 2020-08-08 HISTORY — DX: Unspecified asthma, uncomplicated: J45.909

## 2020-08-08 HISTORY — DX: Moderate protein-calorie malnutrition: E44.0

## 2020-08-08 HISTORY — DX: Chronic kidney disease, stage 4 (severe): N18.4

## 2020-08-08 HISTORY — DX: Hypothyroidism, unspecified: E03.9

## 2020-08-08 HISTORY — DX: Acute diastolic (congestive) heart failure: I50.31

## 2020-08-08 HISTORY — DX: Pleural effusion, not elsewhere classified: J90

## 2020-08-08 HISTORY — DX: Obstructive sleep apnea (adult) (pediatric): G47.33

## 2020-08-08 HISTORY — DX: Nephrotic syndrome with unspecified morphologic changes: N04.9

## 2020-08-08 HISTORY — DX: Essential (primary) hypertension: I10

## 2020-08-08 MED ORDER — NYSTATIN 100000 UNIT/GM EX POWD
Freq: Once | CUTANEOUS | Status: AC
  Filled 2020-08-08: qty 15

## 2020-08-08 MED ORDER — NYSTATIN 100000 UNIT/GM EX POWD: 1.0000 "application " | Freq: Three times a day (TID) | CUTANEOUS | 0 refills | Status: AC

## 2020-08-08 NOTE — ED Triage Notes (Signed)
Patient arrives from home. Patient is being taken care of by daughter who is going to be moving in with her. Per daughter, patient has multiple sores on her chest that she states just showed up today. Patient is at baseline other than the sores.

## 2020-08-08 NOTE — ED Notes (Signed)
Attempted to call patient's daughter to transport patient home. Unable to get a hold of daughter at either number listed.

## 2020-08-08 NOTE — ED Provider Notes (Signed)
[facility] DEPT Provider Note   CSN: [id number] Arrival date & time: 08/08/20  2207     History Chief Complaint  Patient presents with  . Skin Ulcer    Jacqueline Li is a 87 y.o. female.  The history is provided by the patient.  Rash Location: under breasts and groin folds  Quality: itchiness   Severity:  Moderate Onset quality:  Gradual Timing:  Constant Progression:  Unchanged Chronicity:  New Context: not plant contact and not pollen   Relieved by:  Nothing Worsened by:  Nothing Ineffective treatments:  None tried Associated symptoms: no abdominal pain, no diarrhea, no fatigue, no fever, no induration, no throat swelling, no tongue swelling and not wheezing        Past Medical History:  Diagnosis Date  . Acquired hypothyroidism   . Acute diastolic heart failure ([place])   . Chronic renal disease, stage IV ([place])   . Essential hypertension   . Hyperlipidemia   . Hypertension   . Moderate protein-calorie malnutrition ([place])   . Nephrotic syndrome   . Obstructive sleep apnea   . Pleural effusion   . Unspecified asthma, uncomplicated     There are no problems to display for this patient.   Past Surgical History:  Procedure Laterality Date  . IR THORACENTESIS ASP PLEURAL SPACE W/IMG GUIDE  07/07/2020  . KNEE SURGERY       OB History   No obstetric history on file.     History reviewed. No pertinent family history.  Social History   Tobacco Use  . Smoking status: Never Smoker  . Smokeless tobacco: Never Used  Substance Use Topics  . Alcohol use: Not Currently    Alcohol/week: 6.0 standard drinks    Types: 6 Glasses of wine per week  . Drug use: Not Currently    Home Medications Prior to Admission medications   Medication Sig Start Date End Date Taking? Authorizing Provider  nystatin (MYCOSTATIN/NYSTOP) powder Apply 1 application topically 3 (three) times daily. 08/08/20  Yes Adyn Serna, MD  b complex vitamins  capsule Take 1 capsule by mouth daily.    [provider]  furosemide (LASIX) 20 MG tablet Take 20 mg by mouth daily.    [provider]  levothyroxine (SYNTHROID) 25 MCG tablet Take 25 mcg by mouth daily before breakfast.    [provider]  metoprolol succinate (TOPROL-XL) 50 MG 24 hr tablet Take 100 mg by mouth daily. 11/17/18   [provider]  Multiple Vitamins-Minerals (MULTIVITAMIN ADULT) CHEW Chew 2 each by mouth daily.    [provider]    Allergies    Atorvastatin  Review of Systems   Review of Systems  Constitutional: Negative for fatigue and fever.  HENT: Negative for congestion.   Eyes: Negative for redness.  Respiratory: Negative for wheezing.   Cardiovascular: Negative for leg swelling.  Gastrointestinal: Negative for abdominal pain and diarrhea.  Genitourinary: Negative for difficulty urinating.  Skin: Positive for rash.  Neurological: Negative for facial asymmetry.  Psychiatric/Behavioral: Negative for agitation.  All other systems reviewed and are negative.   Physical Exam Updated Vital Signs BP (!) 148/102   Pulse 78   SpO2 100%   Physical Exam Vitals and nursing note reviewed.  Constitutional:      General: She is not in acute distress.    Appearance: Normal appearance.  HENT:     Head: Normocephalic and atraumatic.     Nose: Nose normal.  Eyes:  Conjunctiva/sclera: Conjunctivae normal.     Pupils: Pupils are equal, round, and reactive to light.  Cardiovascular:     Rate and Rhythm: Normal rate and regular rhythm.     Pulses: Normal pulses.     Heart sounds: Normal heart sounds.  Pulmonary:     Effort: Pulmonary effort is normal.     Breath sounds: Normal breath sounds.  Chest:    Abdominal:     General: Abdomen is flat. Bowel sounds are normal.     Palpations: Abdomen is soft.     Tenderness: There is no abdominal tenderness. There is no guarding.  Genitourinary:   Musculoskeletal:         General: Normal range of motion.     Cervical back: Normal range of motion and neck supple.  Skin:    General: Skin is warm and dry.     Capillary Refill: Capillary refill takes less than 2 seconds.  Neurological:     General: No focal deficit present.     Mental Status: She is alert and oriented to person, place, and time.  Psychiatric:        Mood and Affect: Mood normal.        Behavior: Behavior normal.     ED Results / Procedures / Treatments   Labs (all labs ordered are listed, but only abnormal results are displayed) Labs Reviewed - No data to display  EKG None  Radiology No results found.  Procedures Procedures   Medications Ordered in ED Medications  nystatin (MYCOSTATIN/NYSTOP) topical powder (has no administration in time range)    ED Course  I have reviewed the triage vital signs and the nursing notes.  Pertinent labs & imaging results that were available during my care of the patient were reviewed by me and considered in my medical decision making (see chart for details).    Jacqueline Li was evaluated in Emergency Department on 08/08/2020 for the symptoms described in the history of present illness. She was evaluated in the context of the global COVID-19 pandemic, which necessitated consideration that the patient might be at risk for infection with the SARS-CoV-2 virus that causes COVID-19. Institutional protocols and algorithms that pertain to the evaluation of patients at risk for COVID-19 are in a state of rapid change based on information released by regulatory bodies including the CDC and federal and state organizations. These policies and algorithms were followed during the patient's care in the ED.  Final Clinical Impression(s) / ED Diagnoses Final diagnoses:  Candida rash of groin   Return for intractable cough, coughing up blood, fevers >100.4 unrelieved by medication, shortness of breath, intractable vomiting, chest pain, shortness of breath,  weakness, numbness, changes in speech, facial asymmetry, abdominal pain, passing out, Inability to tolerate liquids or food, cough, altered mental status or any concerns. No signs of systemic illness or infection. The patient is nontoxic-appearing on exam and vital signs are within normal limits.  I have reviewed the triage vital signs and the nursing notes. Pertinent labs & imaging results that were available during my care of the patient were reviewed by me and considered in my medical decision making (see chart for details). After history, exam, and medical workup I feel the patient has been appropriately medically screened and is safe for discharge home. Pertinent diagnoses were discussed with the patient. Patient was given return precautions. Rx / DC Orders ED Discharge Orders         Ordered    nystatin (MYCOSTATIN/NYSTOP)  powder  3 times daily        08/08/20 2319           Lucely Leard, MD 08/08/20 2324

## 2020-08-09 DIAGNOSIS — B379 Candidiasis, unspecified: Secondary | ICD-10-CM | POA: Diagnosis not present

## 2020-08-09 NOTE — ED Notes (Addendum)
Still unable to get a hold of patient's daughter. Patient with diagnosed Alzheimer's and is unsure if her door is unlocked. Unable to leave voicemail on mobile number provided in chart.

## 2020-08-09 NOTE — ED Notes (Addendum)
Spoke with patient's daughter. Daughter will be able to be here in approx. 1 hour to get patient.

## 2020-08-17 DIAGNOSIS — N186 End stage renal disease: Secondary | ICD-10-CM | POA: Diagnosis not present

## 2020-08-17 DIAGNOSIS — R945 Abnormal results of liver function studies: Secondary | ICD-10-CM | POA: Diagnosis not present

## 2020-08-17 DIAGNOSIS — F0280 Dementia in other diseases classified elsewhere without behavioral disturbance: Secondary | ICD-10-CM | POA: Diagnosis not present

## 2020-08-17 DIAGNOSIS — J90 Pleural effusion, not elsewhere classified: Secondary | ICD-10-CM | POA: Diagnosis not present

## 2020-08-17 DIAGNOSIS — B372 Candidiasis of skin and nail: Secondary | ICD-10-CM | POA: Diagnosis not present

## 2020-08-17 DIAGNOSIS — G309 Alzheimer's disease, unspecified: Secondary | ICD-10-CM | POA: Diagnosis not present

## 2020-08-17 DIAGNOSIS — I120 Hypertensive chronic kidney disease with stage 5 chronic kidney disease or end stage renal disease: Secondary | ICD-10-CM | POA: Diagnosis not present

## 2020-08-18 DIAGNOSIS — J90 Pleural effusion, not elsewhere classified: Secondary | ICD-10-CM | POA: Diagnosis not present

## 2020-08-18 DIAGNOSIS — N186 End stage renal disease: Secondary | ICD-10-CM | POA: Diagnosis not present

## 2020-08-18 DIAGNOSIS — R945 Abnormal results of liver function studies: Secondary | ICD-10-CM | POA: Diagnosis not present

## 2020-08-18 DIAGNOSIS — F0280 Dementia in other diseases classified elsewhere without behavioral disturbance: Secondary | ICD-10-CM | POA: Diagnosis not present

## 2020-08-18 DIAGNOSIS — G309 Alzheimer's disease, unspecified: Secondary | ICD-10-CM | POA: Diagnosis not present

## 2020-08-18 DIAGNOSIS — I120 Hypertensive chronic kidney disease with stage 5 chronic kidney disease or end stage renal disease: Secondary | ICD-10-CM | POA: Diagnosis not present

## 2020-08-19 DIAGNOSIS — F0280 Dementia in other diseases classified elsewhere without behavioral disturbance: Secondary | ICD-10-CM | POA: Diagnosis not present

## 2020-08-19 DIAGNOSIS — I120 Hypertensive chronic kidney disease with stage 5 chronic kidney disease or end stage renal disease: Secondary | ICD-10-CM | POA: Diagnosis not present

## 2020-08-19 DIAGNOSIS — R945 Abnormal results of liver function studies: Secondary | ICD-10-CM | POA: Diagnosis not present

## 2020-08-19 DIAGNOSIS — J90 Pleural effusion, not elsewhere classified: Secondary | ICD-10-CM | POA: Diagnosis not present

## 2020-08-19 DIAGNOSIS — N186 End stage renal disease: Secondary | ICD-10-CM | POA: Diagnosis not present

## 2020-08-19 DIAGNOSIS — G309 Alzheimer's disease, unspecified: Secondary | ICD-10-CM | POA: Diagnosis not present

## 2020-08-20 LAB — ACID FAST CULTURE WITH REFLEXED SENSITIVITIES (MYCOBACTERIA): Acid Fast Culture: NEGATIVE

## 2020-08-27 DIAGNOSIS — F0280 Dementia in other diseases classified elsewhere without behavioral disturbance: Secondary | ICD-10-CM | POA: Diagnosis not present

## 2020-08-27 DIAGNOSIS — G309 Alzheimer's disease, unspecified: Secondary | ICD-10-CM | POA: Diagnosis not present

## 2020-08-27 DIAGNOSIS — J90 Pleural effusion, not elsewhere classified: Secondary | ICD-10-CM | POA: Diagnosis not present

## 2020-08-27 DIAGNOSIS — R945 Abnormal results of liver function studies: Secondary | ICD-10-CM | POA: Diagnosis not present

## 2020-08-27 DIAGNOSIS — N186 End stage renal disease: Secondary | ICD-10-CM | POA: Diagnosis not present

## 2020-08-27 DIAGNOSIS — I120 Hypertensive chronic kidney disease with stage 5 chronic kidney disease or end stage renal disease: Secondary | ICD-10-CM | POA: Diagnosis not present

## 2020-09-12 DEATH — deceased

## 2021-09-17 IMAGING — US US BIOPSY
1 series · 7 of 7 positions shown · non-contrast
Comparison: None.

INDICATION: [AGE] female with nephrotic syndrome.

EXAM:
ULTRASOUND GUIDED RENAL BIOPSY

[Series 1: us biopsy (kidney) · 7 of 7 slices shown]
[im 1/7]
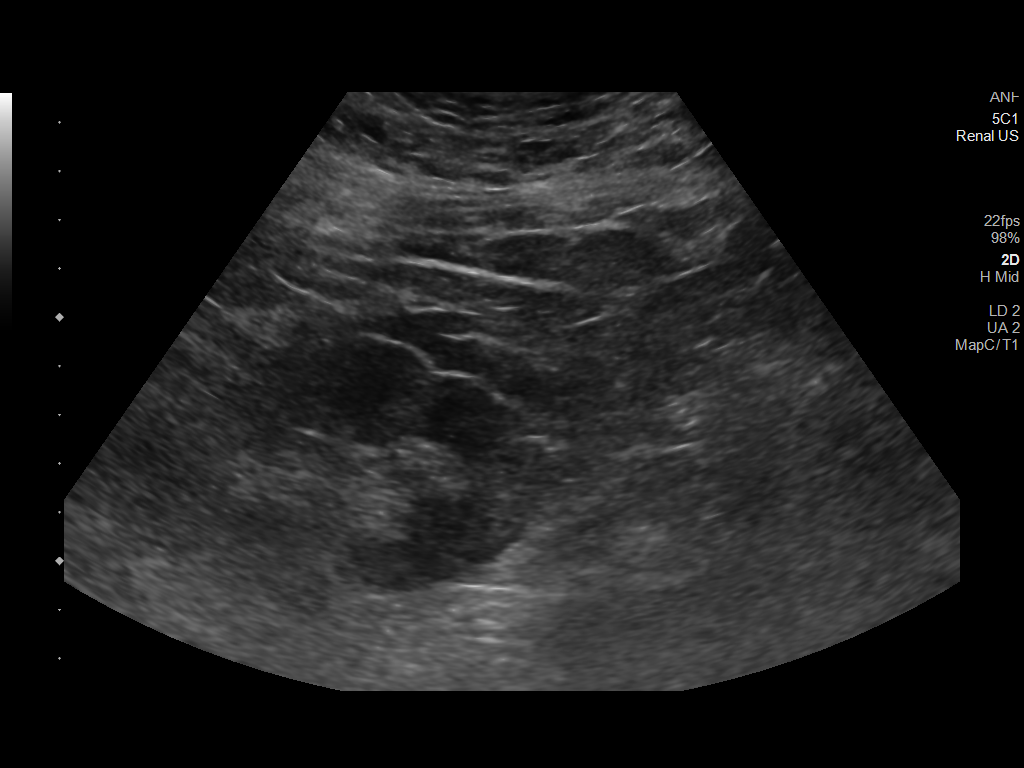
[im 2/7]
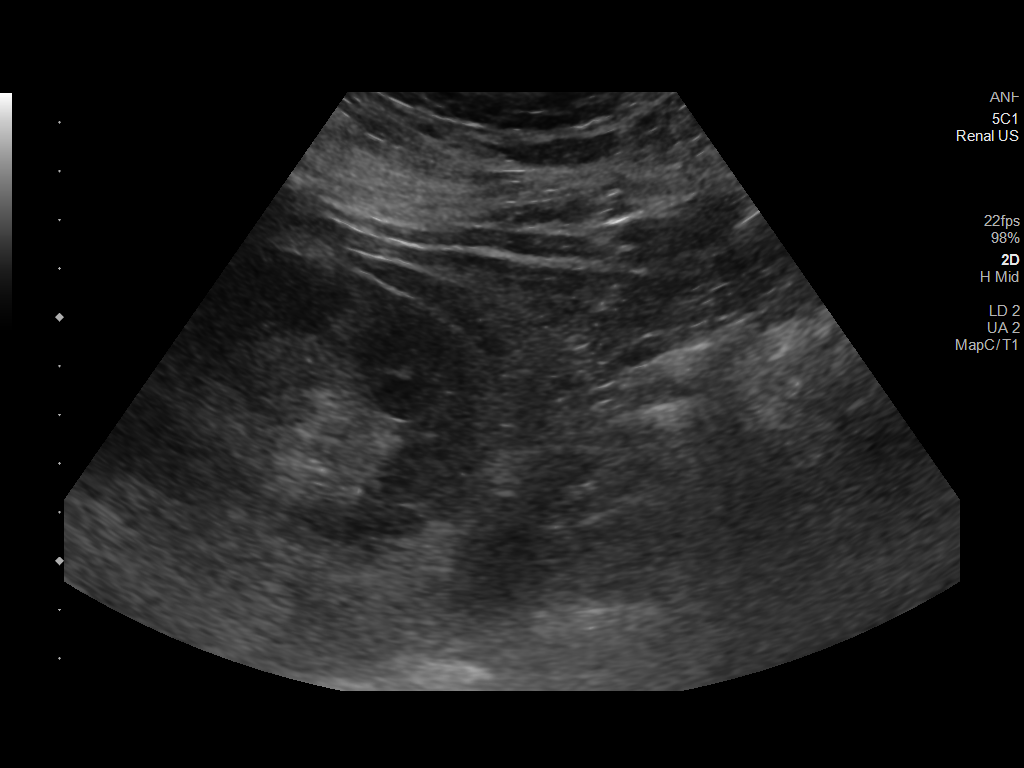
[im 3/7]
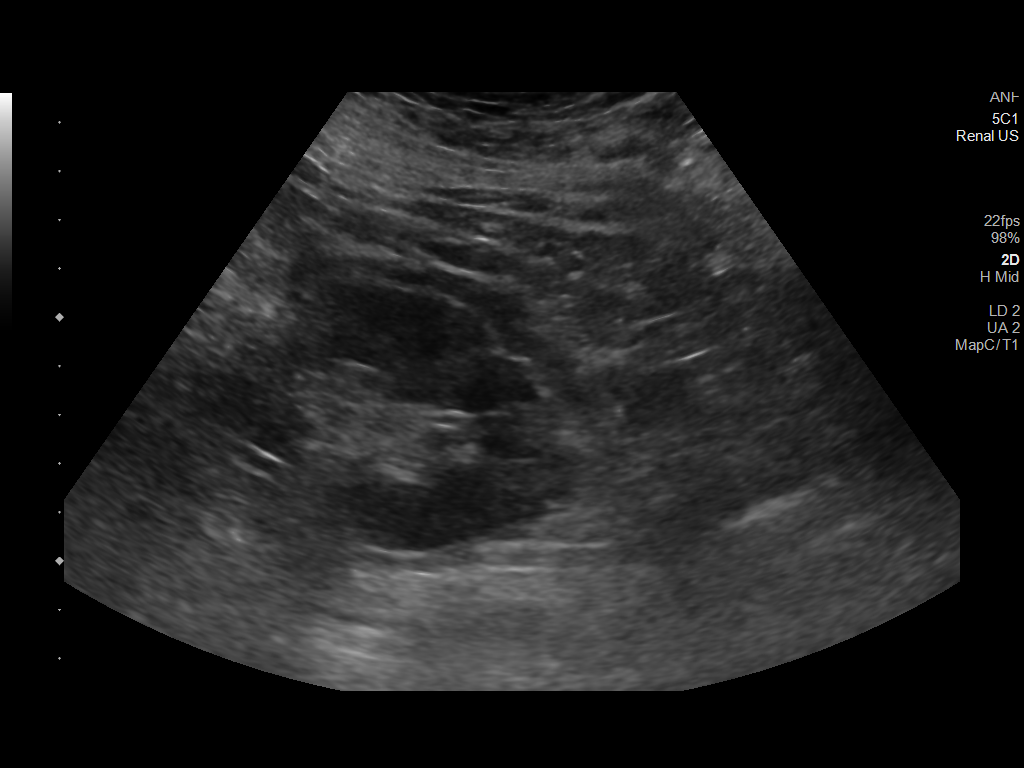
[im 4/7]
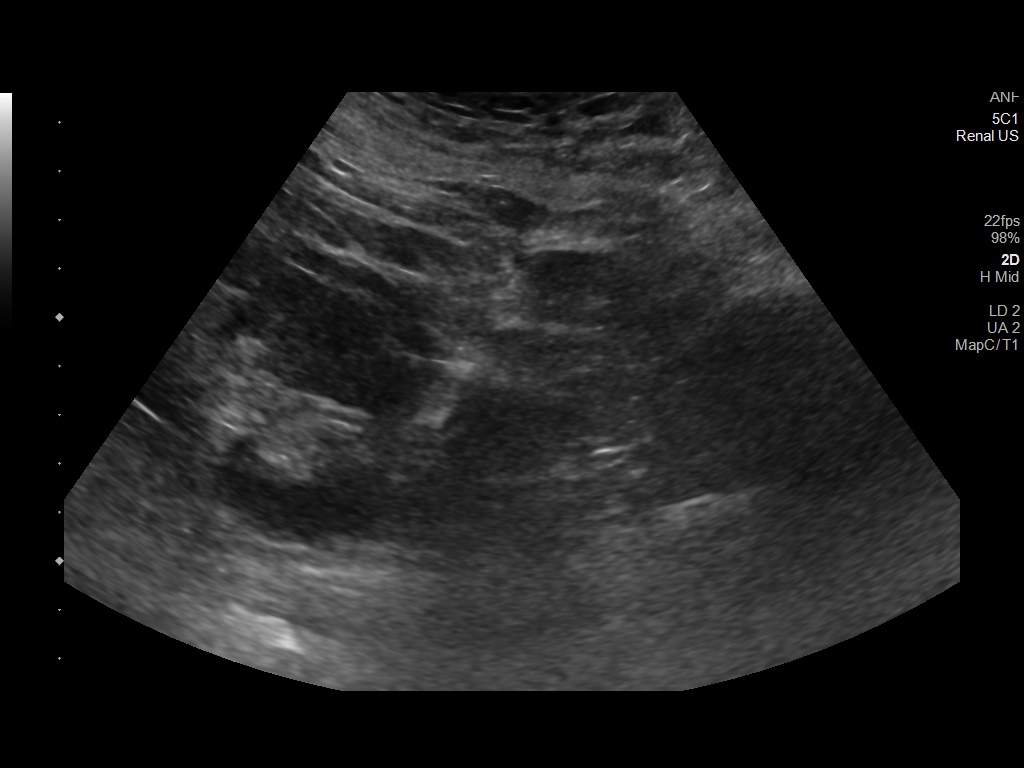
[im 5/7]
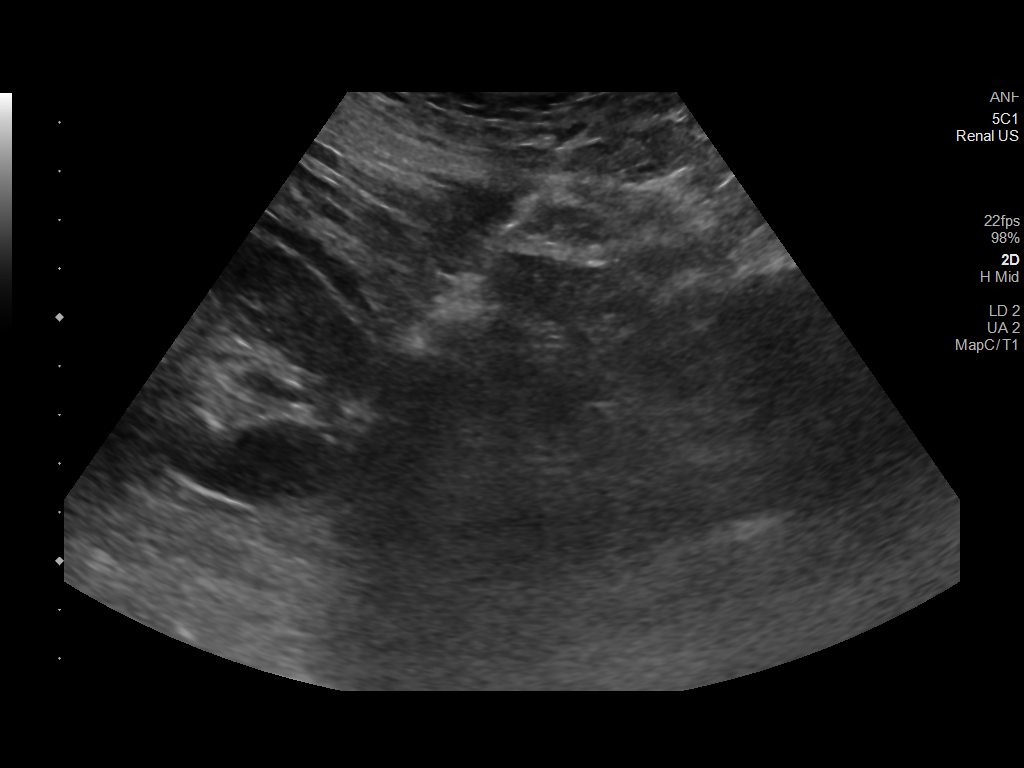
[im 6/7]
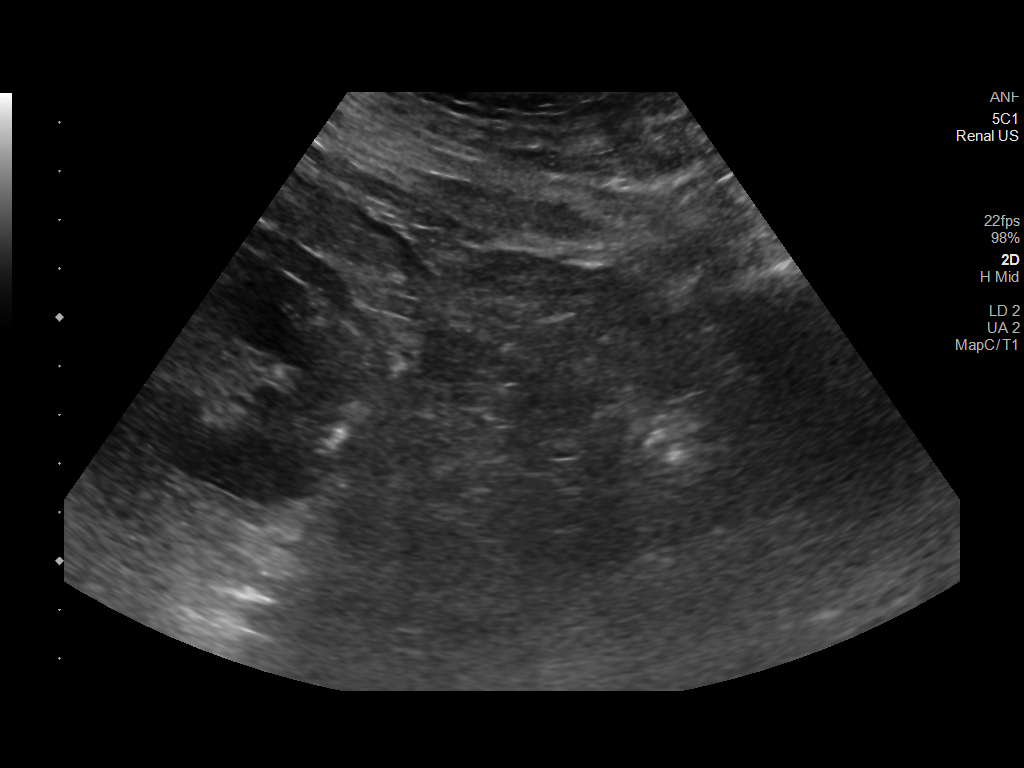
[im 7/7]
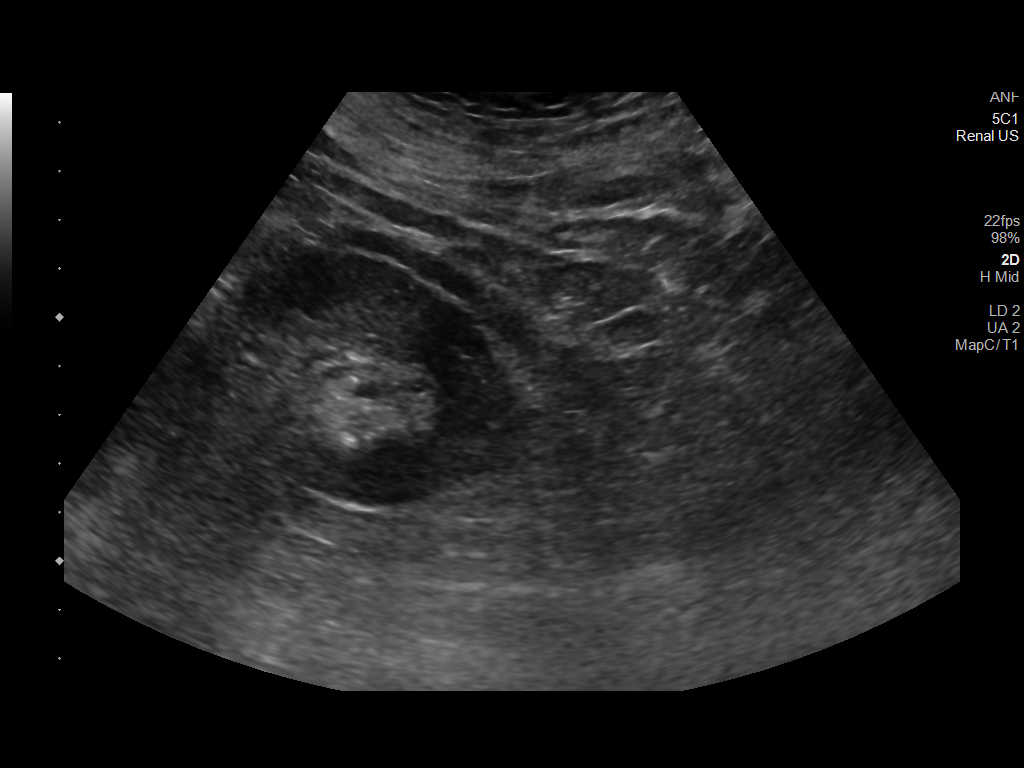

[7 of 7 positions shown; findings below may reference images not displayed]

MEDICATIONS:
None.

ANESTHESIA/SEDATION:
None.

COMPLICATIONS:
None immediate

PROCEDURE:
Informed written consent was obtained from the patient after a
discussion of the risks, benefits and alternatives to treatment. The
patient understands and consents the procedure. A timeout was
performed prior to the initiation of the procedure.

Ultrasound scanning was performed of the bilateral flanks. The
inferior pole of the left kidney was selected for biopsy due to
location and sonographic window. The procedure was planned. The
operative site was prepped and draped in the usual sterile fashion.
The overlying soft tissues were anesthetized with 1% lidocaine with
epinephrine. A 17 gauge core needle biopsy device was advanced into
the inferior cortex of the left kidney and 2 core biopsies were
obtained under direct ultrasound guidance. Images were saved for
documentation purposes. The biopsy device was removed and hemostasis
was obtained with manual compression. Post procedural scanning was
negative for significant post procedural hemorrhage or additional
complication. A dressing was placed. The patient tolerated the
procedure well without immediate post procedural complication.
IMPRESSION: Technically successful ultrasound guided left renal biopsy.
